# Patient Record
Sex: Male | Born: 1970 | Race: Asian | Hispanic: No | Marital: Married | Smoking: Never smoker
Health system: Southern US, Community
[De-identification: ages and names within clinical notes are randomized; demographics above are authoritative.]

## PROBLEM LIST (undated history)

## (undated) DIAGNOSIS — G4730 Sleep apnea, unspecified: Secondary | ICD-10-CM

## (undated) DIAGNOSIS — E119 Type 2 diabetes mellitus without complications: Secondary | ICD-10-CM

## (undated) DIAGNOSIS — I10 Essential (primary) hypertension: Secondary | ICD-10-CM

## (undated) HISTORY — DX: Sleep apnea, unspecified: G47.30

## (undated) HISTORY — DX: Type 2 diabetes mellitus without complications: E11.9

## (undated) HISTORY — PX: OTHER SURGICAL HISTORY: SHX169

---

## 2016-10-15 DIAGNOSIS — I10 Essential (primary) hypertension: Secondary | ICD-10-CM | POA: Insufficient documentation

## 2016-10-25 DIAGNOSIS — E782 Mixed hyperlipidemia: Secondary | ICD-10-CM | POA: Insufficient documentation

## 2017-06-24 DIAGNOSIS — E119 Type 2 diabetes mellitus without complications: Secondary | ICD-10-CM | POA: Insufficient documentation

## 2017-06-24 DIAGNOSIS — R748 Abnormal levels of other serum enzymes: Secondary | ICD-10-CM | POA: Insufficient documentation

## 2017-06-24 DIAGNOSIS — L853 Xerosis cutis: Secondary | ICD-10-CM | POA: Insufficient documentation

## 2017-06-24 DIAGNOSIS — G4733 Obstructive sleep apnea (adult) (pediatric): Secondary | ICD-10-CM | POA: Insufficient documentation

## 2017-09-30 DIAGNOSIS — E538 Deficiency of other specified B group vitamins: Secondary | ICD-10-CM | POA: Insufficient documentation

## 2018-01-06 ENCOUNTER — Other Ambulatory Visit: Payer: Self-pay | Admitting: Internal Medicine

## 2018-01-06 DIAGNOSIS — R748 Abnormal levels of other serum enzymes: Secondary | ICD-10-CM

## 2018-01-06 DIAGNOSIS — Z0000 Encounter for general adult medical examination without abnormal findings: Secondary | ICD-10-CM

## 2018-01-06 DIAGNOSIS — E559 Vitamin D deficiency, unspecified: Secondary | ICD-10-CM | POA: Insufficient documentation

## 2018-01-13 ENCOUNTER — Ambulatory Visit: Payer: BLUE CROSS/BLUE SHIELD

## 2018-01-15 ENCOUNTER — Ambulatory Visit
Admission: RE | Admit: 2018-01-15 | Discharge: 2018-01-15 | Disposition: A | Discharge status: Home / Self Care | Payer: BLUE CROSS/BLUE SHIELD | Source: Ambulatory Visit | Attending: Internal Medicine | Admitting: Internal Medicine

## 2018-01-15 DIAGNOSIS — Z0000 Encounter for general adult medical examination without abnormal findings: Secondary | ICD-10-CM | POA: Diagnosis not present

## 2018-01-15 DIAGNOSIS — R748 Abnormal levels of other serum enzymes: Secondary | ICD-10-CM | POA: Diagnosis present

## 2018-08-18 ENCOUNTER — Ambulatory Visit
Admission: RE | Admit: 2018-08-18 | Discharge: 2018-08-18 | Disposition: A | Discharge status: Home / Self Care | Payer: BLUE CROSS/BLUE SHIELD | Source: Ambulatory Visit | Attending: Internal Medicine | Admitting: Internal Medicine

## 2018-08-18 ENCOUNTER — Other Ambulatory Visit: Payer: Self-pay | Admitting: Internal Medicine

## 2018-08-18 DIAGNOSIS — R1032 Left lower quadrant pain: Secondary | ICD-10-CM | POA: Diagnosis present

## 2018-08-18 HISTORY — DX: Essential (primary) hypertension: I10

## 2018-08-18 HISTORY — DX: Type 2 diabetes mellitus without complications: E11.9

## 2018-08-18 LAB — POCT I-STAT CREATININE: CREATININE: 0.7 mg/dL (ref 0.61–1.24)

## 2018-08-18 MED ORDER — IOHEXOL 300 MG/ML  SOLN
100.0000 mL | Freq: Once | INTRAMUSCULAR | Status: AC | PRN
  Administered 2018-08-18: 100 mL via INTRAVENOUS

## 2018-08-21 DIAGNOSIS — K5909 Other constipation: Secondary | ICD-10-CM | POA: Insufficient documentation

## 2019-09-08 ENCOUNTER — Other Ambulatory Visit: Payer: Self-pay | Admitting: Internal Medicine

## 2019-09-08 DIAGNOSIS — R748 Abnormal levels of other serum enzymes: Secondary | ICD-10-CM

## 2019-09-15 ENCOUNTER — Other Ambulatory Visit: Payer: Self-pay

## 2019-09-15 ENCOUNTER — Ambulatory Visit
Admission: RE | Admit: 2019-09-15 | Discharge: 2019-09-15 | Disposition: A | Discharge status: Home / Self Care | Payer: BC Managed Care – PPO | Source: Ambulatory Visit | Attending: Internal Medicine | Admitting: Internal Medicine

## 2019-09-15 DIAGNOSIS — R748 Abnormal levels of other serum enzymes: Secondary | ICD-10-CM | POA: Diagnosis present

## 2019-11-28 ENCOUNTER — Ambulatory Visit: Payer: BC Managed Care – PPO | Attending: Internal Medicine

## 2019-11-28 DIAGNOSIS — Z23 Encounter for immunization: Secondary | ICD-10-CM

## 2019-11-28 NOTE — Progress Notes (Signed)
   Covid-19 Vaccination Clinic  Name:  Garrett Haley    MRN: [id number] DOB: [birth year]  11/28/2019  [name] was observed post Covid-19 immunization for 15 minutes without incident. He was provided with Vaccine Information Sheet and instruction to access the V-Safe system.   [name] was instructed to call 911 with any severe reactions post vaccine: Marland Kitchen Difficulty breathing  . Swelling of face and throat  . A fast heartbeat  . A bad rash all over body  . Dizziness and weakness   Immunizations Administered    Name Date Dose VIS Date Route   Pfizer COVID-19 Vaccine 11/28/2019 10:04 AM 0.3 mL 08/21/2019 Intramuscular   Manufacturer: [employer], [employer]   Lot: [id number]   NDC: [address]-1

## 2019-12-23 ENCOUNTER — Ambulatory Visit: Payer: BC Managed Care – PPO | Attending: Internal Medicine

## 2019-12-23 DIAGNOSIS — Z23 Encounter for immunization: Secondary | ICD-10-CM

## 2019-12-23 NOTE — Progress Notes (Signed)
   Covid-19 Vaccination Clinic  Name:  Phillippe A. Gift    MRN: [id number] DOB: 1971-08-31  12/23/2019  Mr. Cornforth was observed post Covid-19 immunization for 15 minutes without incident. He was provided with Vaccine Information Sheet and instruction to access the V-Safe system.   Mr. Channing was instructed to call 911 with any severe reactions post vaccine: Marland Kitchen Difficulty breathing  . Swelling of face and throat  . A fast heartbeat  . A bad rash all over body  . Dizziness and weakness   Immunizations Administered    Name Date Dose VIS Date Route   Pfizer COVID-19 Vaccine 12/23/2019 10:06 AM 0.3 mL 08/21/2019 Intramuscular   Manufacturer: [employer], [employer]   Lot: [id number]   NDC: [address]-9

## 2020-07-08 IMAGING — US US ABDOMEN COMPLETE
1 series · 13 of 25 positions shown · non-contrast
Comparison: CT dated August 18, 2018.

CLINICAL DATA: Abnormal liver function tests.

EXAM:
ABDOMEN ULTRASOUND COMPLETE

[Series 1: us abdomen complete · 0.25mm/px · 13 of 81 slices shown]
[im 1/81]
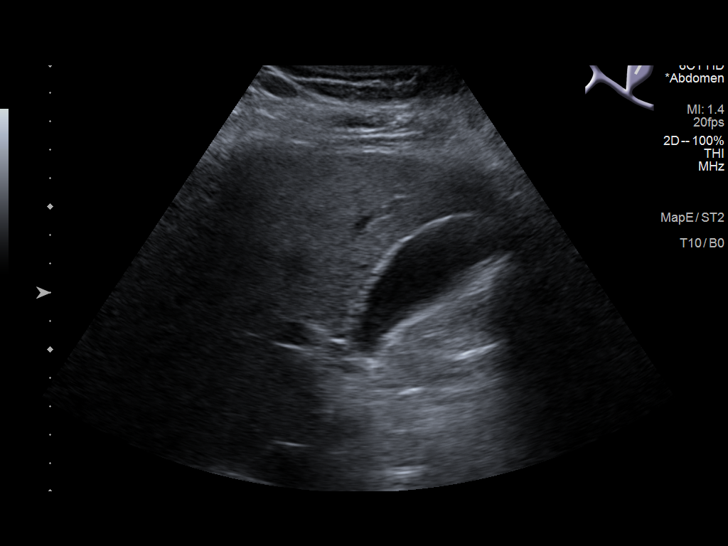
[im 7/81]
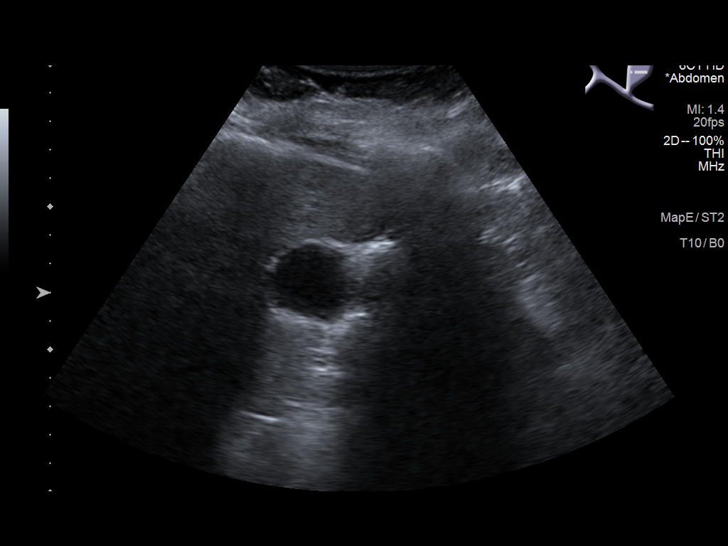
[im 14/81]
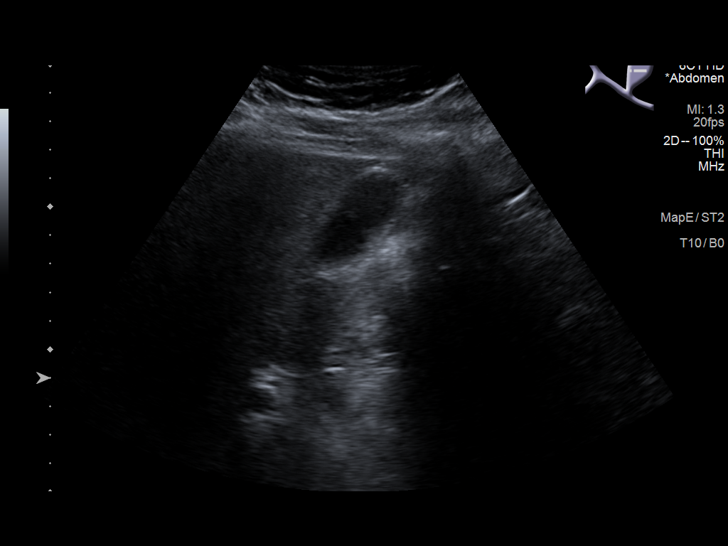
[im 21/81]
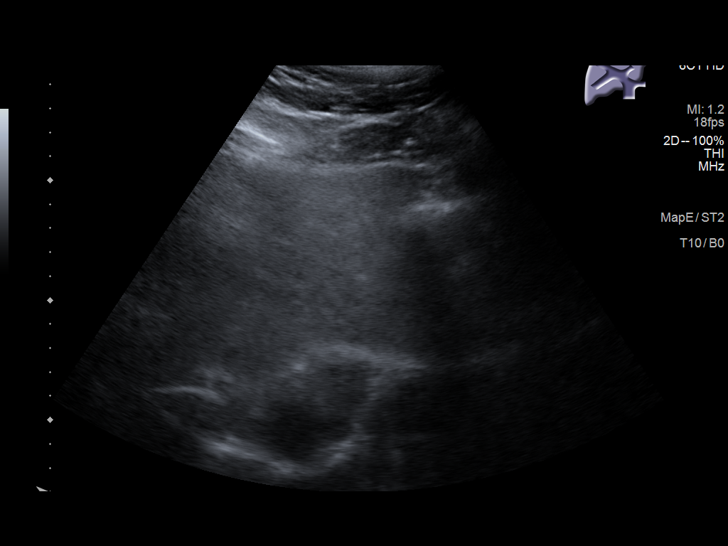
[im 27/81]
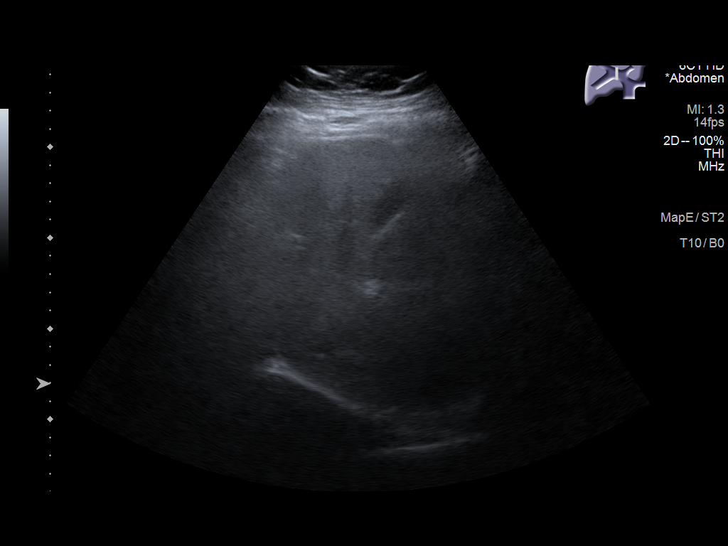
[im 34/81]
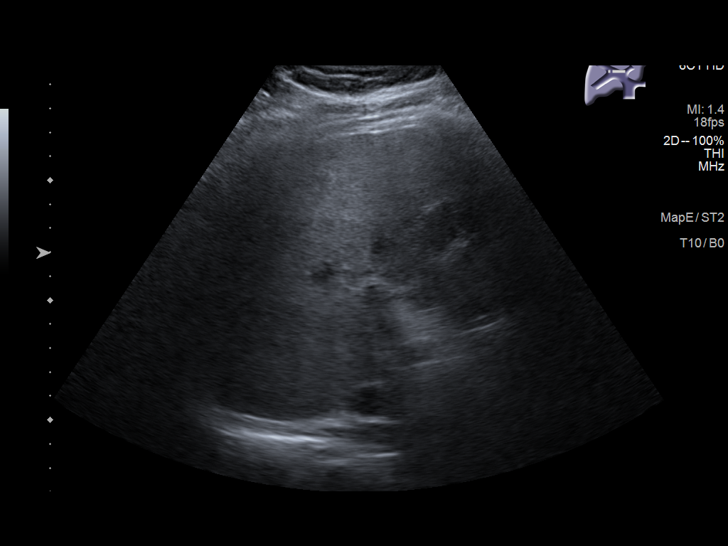
[im 41/81]
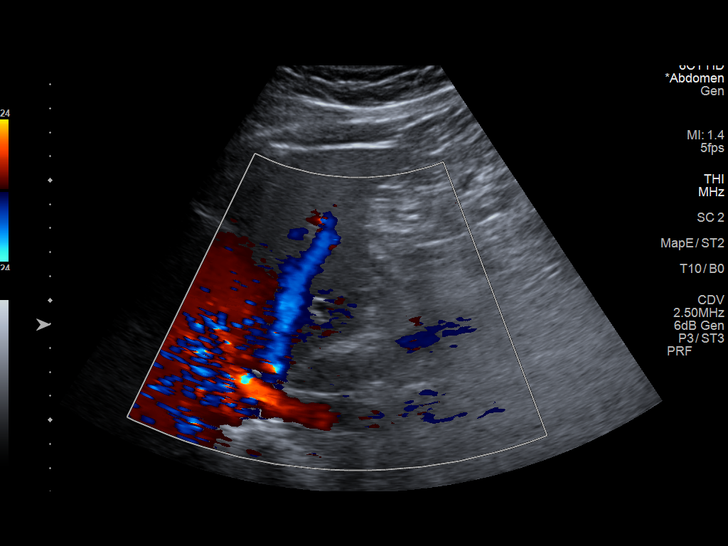
[im 47/81]
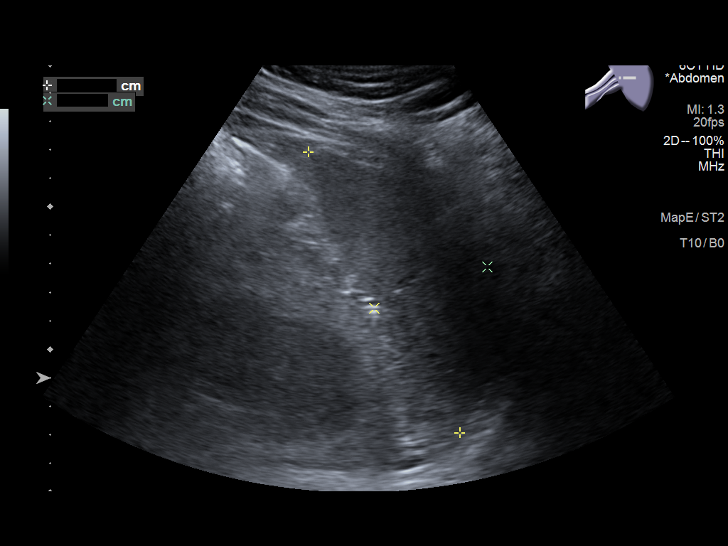
[im 54/81]
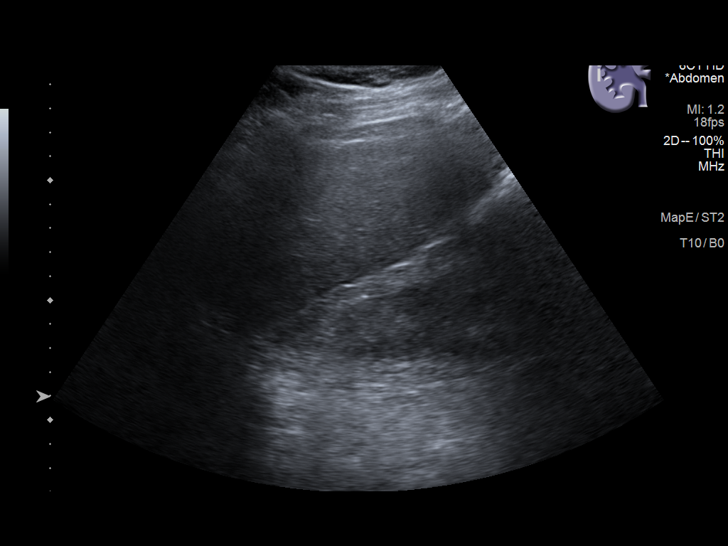
[im 61/81]
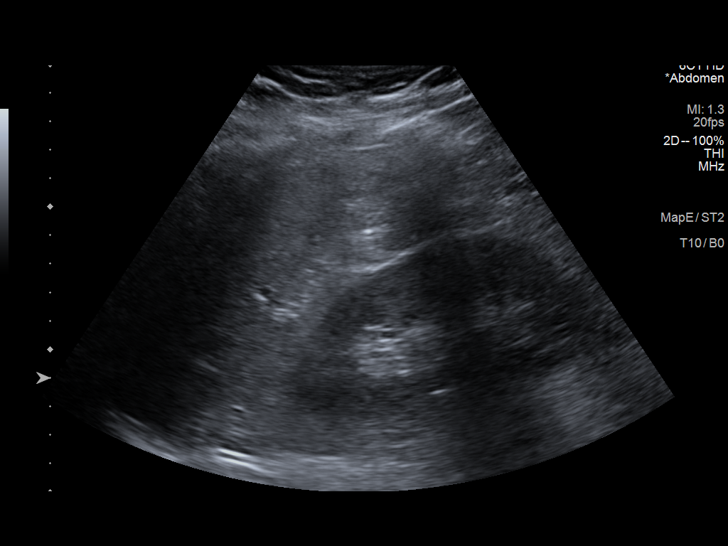
[im 67/81]
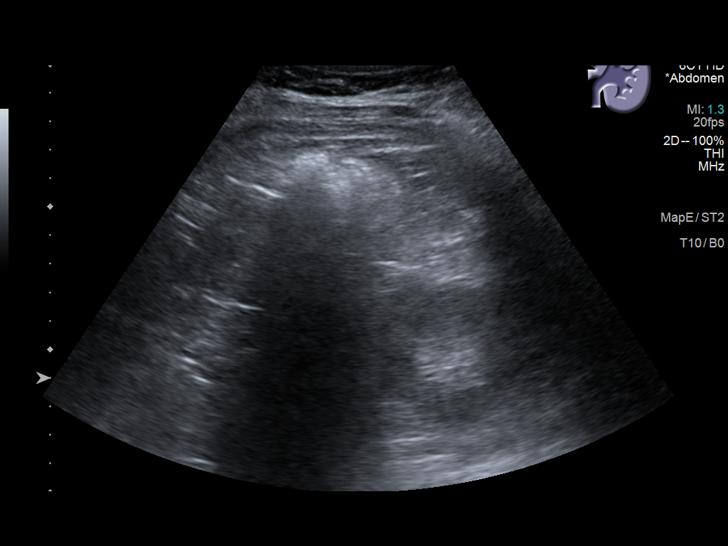
[im 74/81]
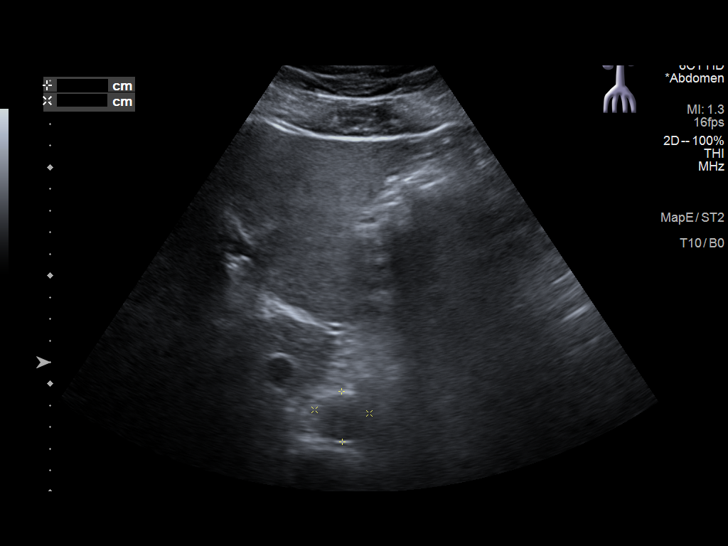
[im 81/81]
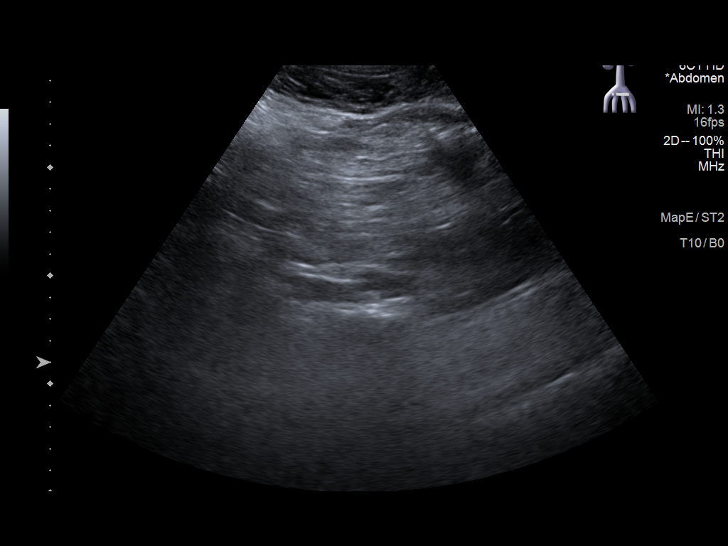

[13 of 25 positions shown; findings below may reference images not displayed]

FINDINGS: Gallbladder: No gallstones or wall thickening visualized. No
sonographic Murphy sign noted by sonographer.

Common bile duct: Diameter: 3.2 mm

Liver: Diffuse increased echogenicity with slightly heterogeneous
liver. Appearance typically secondary to fatty infiltration.
Fibrosis secondary consideration. No secondary findings of cirrhosis
noted. No focal hepatic lesion or intrahepatic biliary duct
dilatation. Portal vein is patent on color Doppler imaging with
normal direction of blood flow towards the liver.

IVC: No abnormality visualized.

Pancreas: The pancreas was suboptimally evaluated secondary to
overlying bowel gas and poor sonographic windows.

Spleen: Size and appearance within normal limits.

Right Kidney: Length: 10.7 cm. Echogenicity within normal limits. No
mass or hydronephrosis visualized.

Left Kidney: Length: 10.4 cm. Echogenicity within normal limits. No
mass or hydronephrosis visualized.

Abdominal aorta: No aneurysm visualized.

Other findings: None.
IMPRESSION: 1. No acute abnormality.  No evidence for cholelithiasis.
2. Hepatic steatosis.
3. Pancreas poorly evaluated secondary to poor sonographic windows.

## 2021-07-19 ENCOUNTER — Ambulatory Visit
Admission: RE | Admit: 2021-07-19 | Discharge: 2021-07-19 | Disposition: A | Discharge status: Home / Self Care | Payer: 59 | Source: Ambulatory Visit | Attending: Physician Assistant | Admitting: Physician Assistant

## 2021-07-19 ENCOUNTER — Other Ambulatory Visit: Payer: Self-pay

## 2021-07-19 ENCOUNTER — Other Ambulatory Visit: Payer: Self-pay | Admitting: Physician Assistant

## 2021-07-19 ENCOUNTER — Other Ambulatory Visit (HOSPITAL_COMMUNITY): Payer: Self-pay | Admitting: Physician Assistant

## 2021-07-19 DIAGNOSIS — M7989 Other specified soft tissue disorders: Secondary | ICD-10-CM | POA: Diagnosis present

## 2022-05-12 IMAGING — US US EXTREM LOW VENOUS*R*
1 series · 14 of 24 positions shown · non-contrast
Comparison: None.

CLINICAL DATA: Acute right lower extremity swelling.

EXAM:
Right LOWER EXTREMITY VENOUS DOPPLER ULTRASOUND
TECHNIQUE: Gray-scale sonography with compression, as well as color and duplex
ultrasound, were performed to evaluate the deep venous system(s)
from the level of the common femoral vein through the popliteal and
proximal calf veins.

[Series 1: us extrem low venous*right* · 0.11mm/px · 14 of 33 slices shown]
[im 1/33]
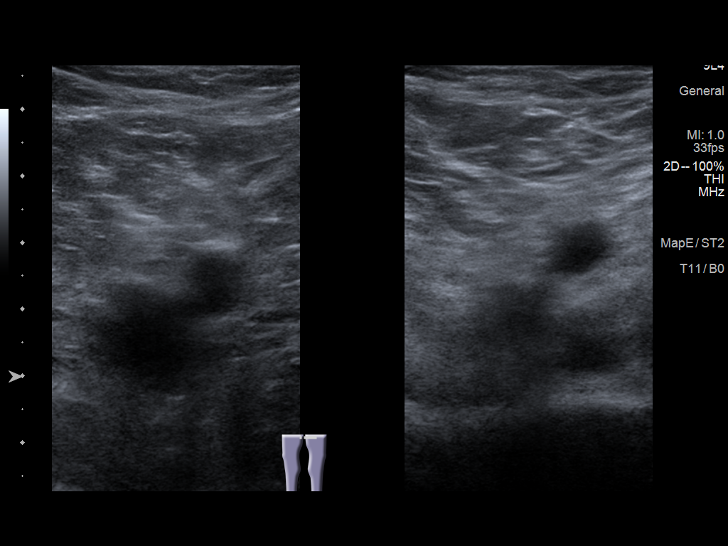
[im 3/33]
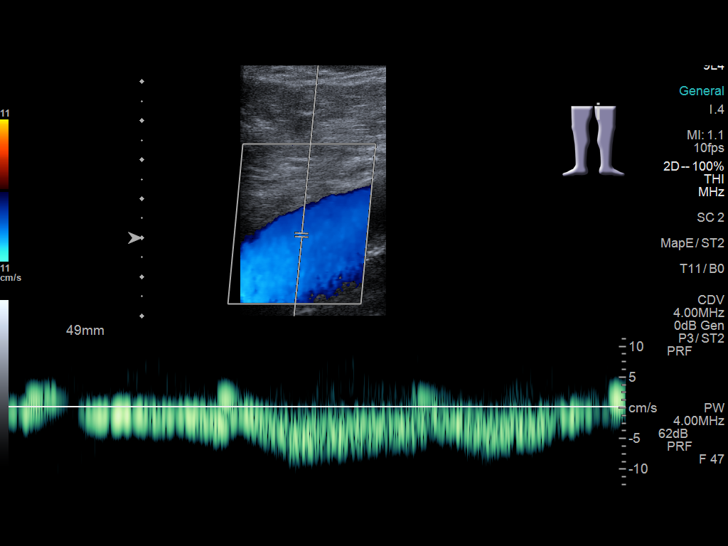
[im 6/33]
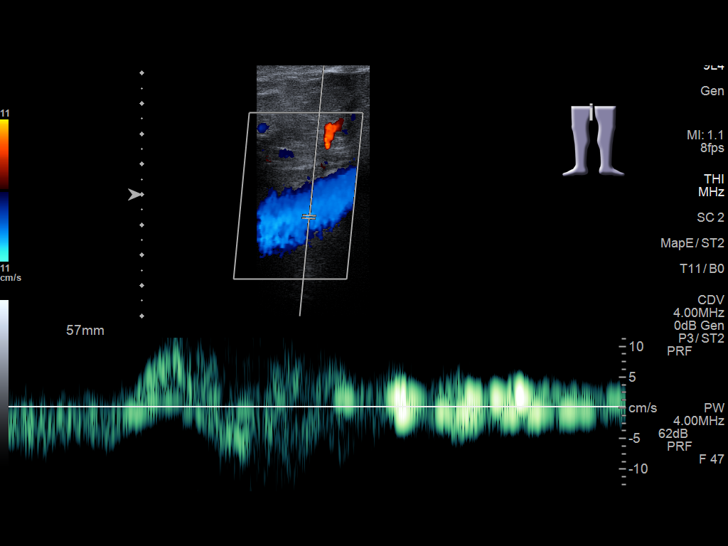
[im 9/33]
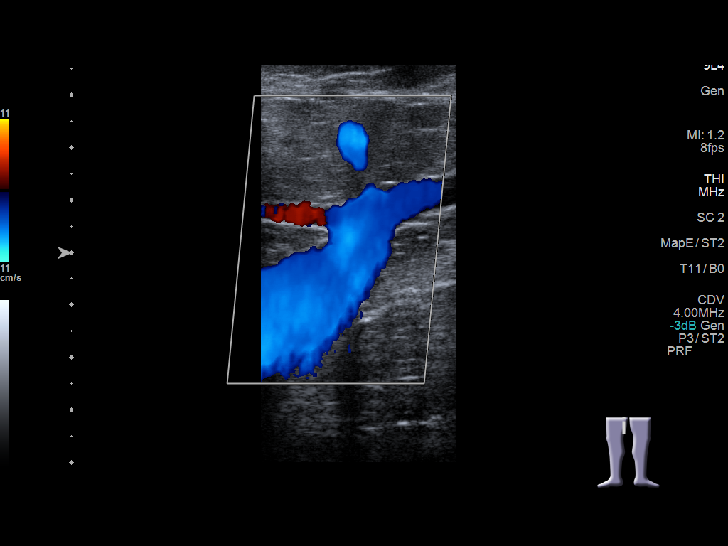
[im 10/33]
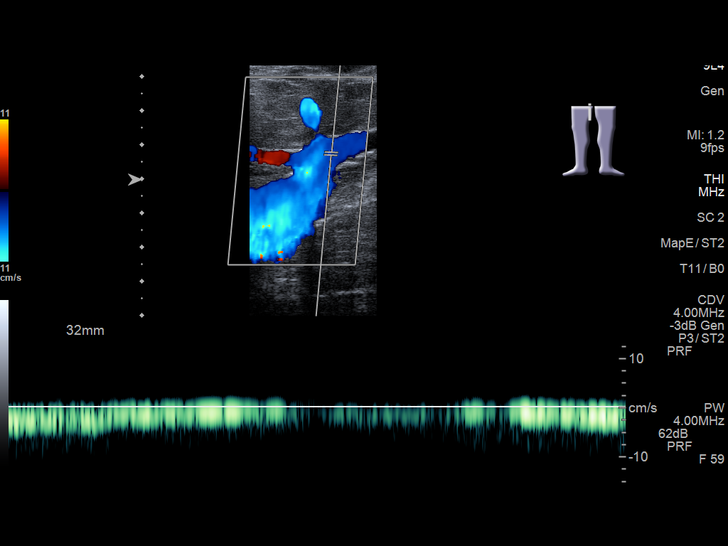
[im 13/33]
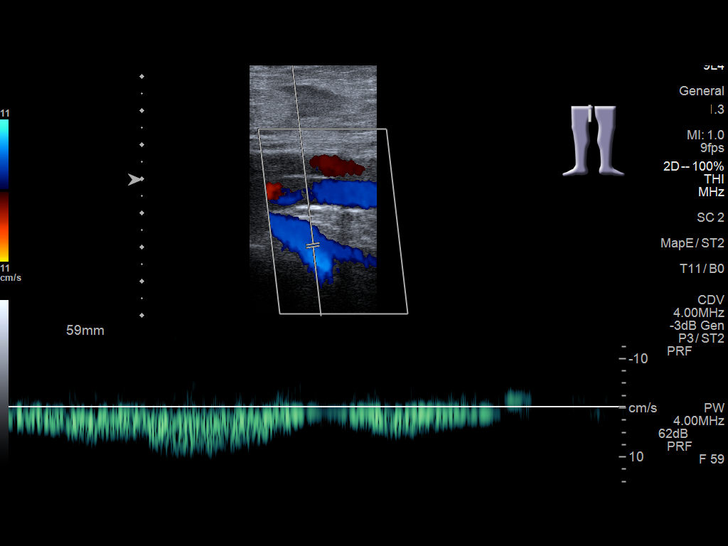
[im 16/33]
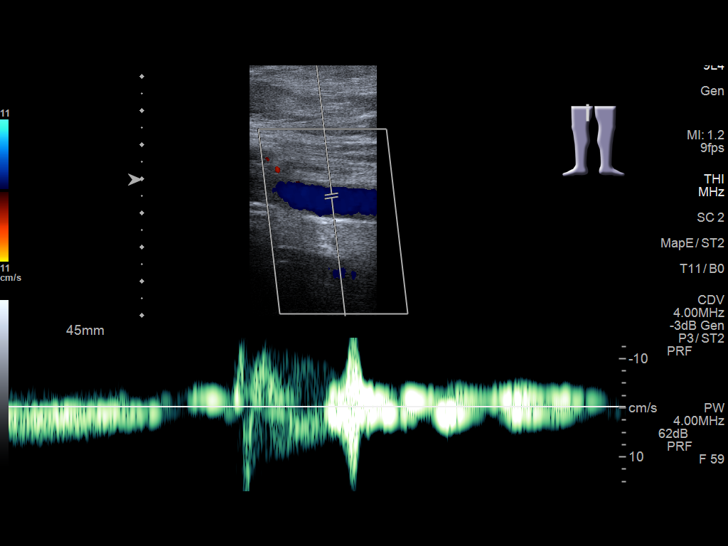
[im 17/33]
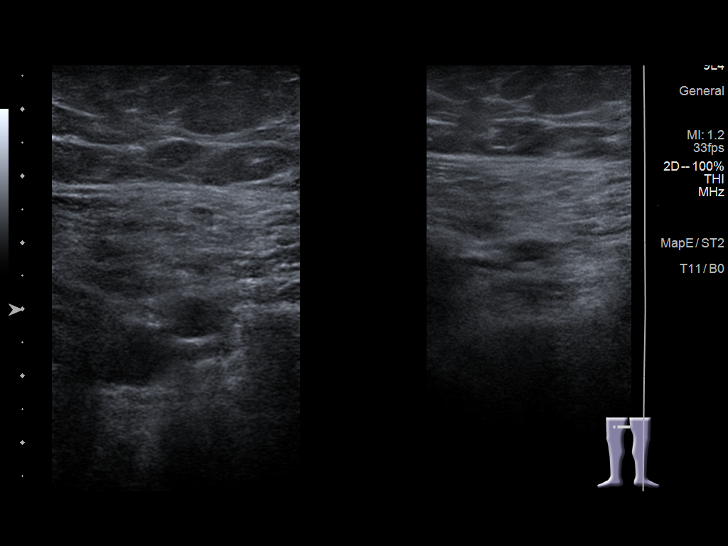
[im 20/33]
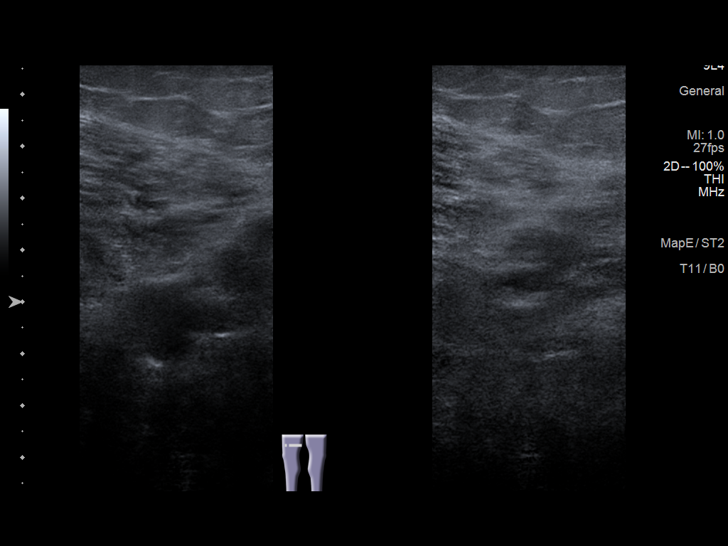
[im 23/33]
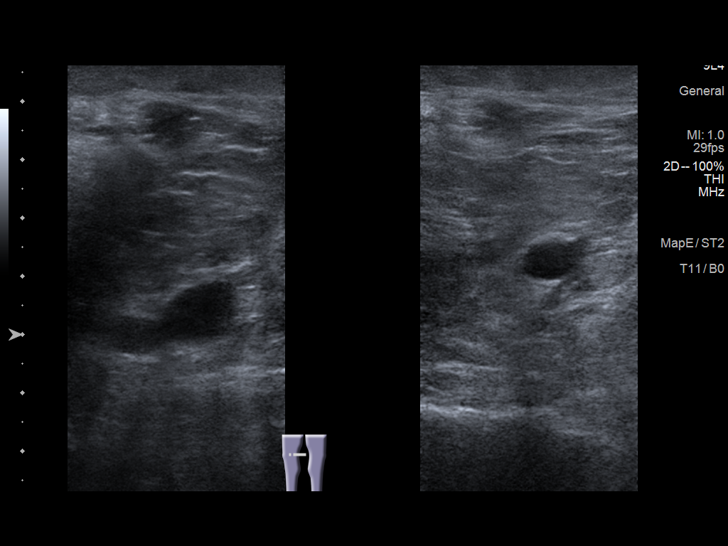
[im 26/33]
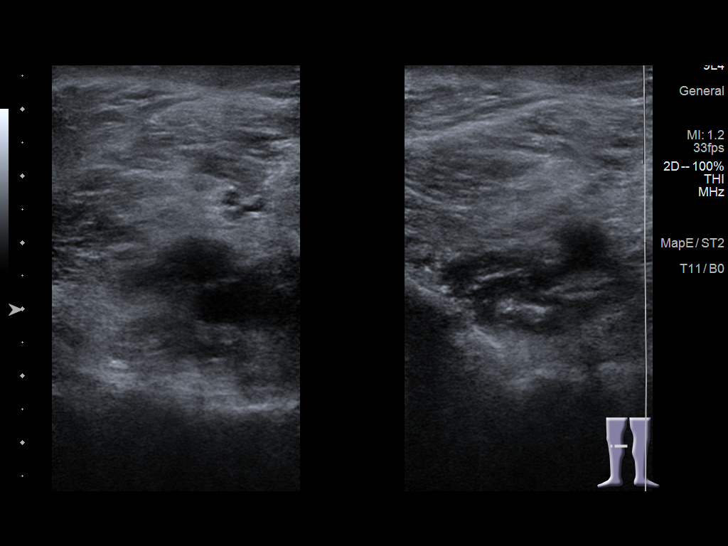
[im 27/33]
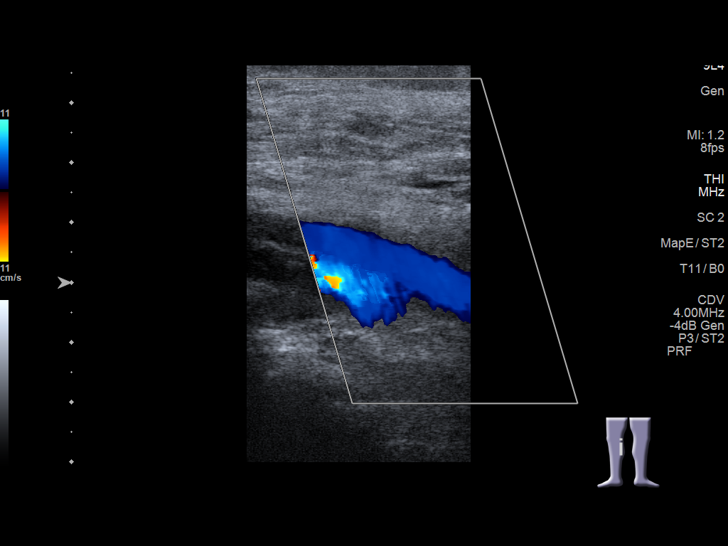
[im 30/33]
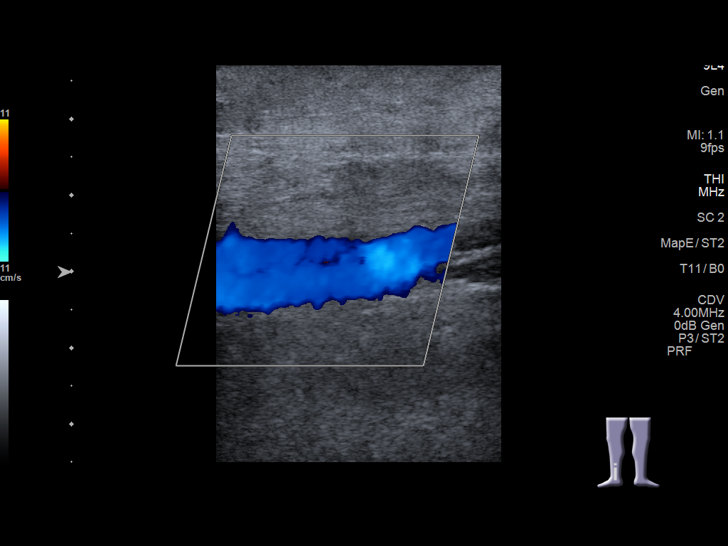
[im 33/33]
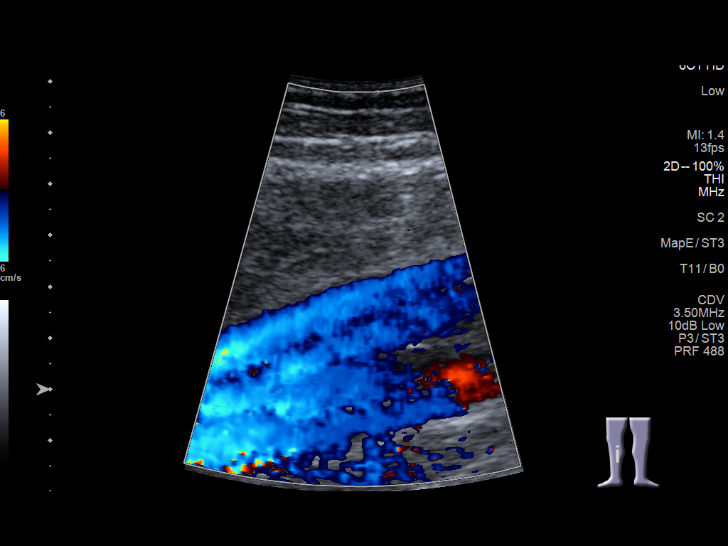

[14 of 24 positions shown; findings below may reference images not displayed]

FINDINGS: VENOUS

Normal compressibility of the common femoral, superficial femoral,
and popliteal veins, as well as the visualized calf veins.
Visualized portions of profunda femoral vein and great saphenous
vein unremarkable. No filling defects to suggest DVT on grayscale or
color Doppler imaging. Doppler waveforms show normal direction of
venous flow, normal respiratory plasticity and response to
augmentation.

Limited views of the contralateral common femoral vein are
unremarkable.

OTHER

None.

Limitations: Limited due to soft tissue edema.
IMPRESSION: Negative.

## 2022-09-20 DIAGNOSIS — E559 Vitamin D deficiency, unspecified: Secondary | ICD-10-CM | POA: Diagnosis not present

## 2022-09-20 DIAGNOSIS — Z0000 Encounter for general adult medical examination without abnormal findings: Secondary | ICD-10-CM | POA: Diagnosis not present

## 2022-09-20 DIAGNOSIS — Z125 Encounter for screening for malignant neoplasm of prostate: Secondary | ICD-10-CM | POA: Diagnosis not present

## 2022-09-20 DIAGNOSIS — E119 Type 2 diabetes mellitus without complications: Secondary | ICD-10-CM | POA: Diagnosis not present

## 2022-09-20 DIAGNOSIS — E538 Deficiency of other specified B group vitamins: Secondary | ICD-10-CM | POA: Diagnosis not present

## 2022-09-27 DIAGNOSIS — E559 Vitamin D deficiency, unspecified: Secondary | ICD-10-CM | POA: Diagnosis not present

## 2022-09-27 DIAGNOSIS — Z0000 Encounter for general adult medical examination without abnormal findings: Secondary | ICD-10-CM | POA: Diagnosis not present

## 2022-09-27 DIAGNOSIS — E1165 Type 2 diabetes mellitus with hyperglycemia: Secondary | ICD-10-CM | POA: Diagnosis not present

## 2022-09-27 DIAGNOSIS — E782 Mixed hyperlipidemia: Secondary | ICD-10-CM | POA: Diagnosis not present

## 2022-09-27 DIAGNOSIS — E1100 Type 2 diabetes mellitus with hyperosmolarity without nonketotic hyperglycemic-hyperosmolar coma (NKHHC): Secondary | ICD-10-CM | POA: Diagnosis not present

## 2022-09-27 DIAGNOSIS — H539 Unspecified visual disturbance: Secondary | ICD-10-CM | POA: Diagnosis not present

## 2022-09-27 DIAGNOSIS — E538 Deficiency of other specified B group vitamins: Secondary | ICD-10-CM | POA: Diagnosis not present

## 2022-11-08 DIAGNOSIS — E538 Deficiency of other specified B group vitamins: Secondary | ICD-10-CM | POA: Diagnosis not present

## 2022-11-08 DIAGNOSIS — E1165 Type 2 diabetes mellitus with hyperglycemia: Secondary | ICD-10-CM | POA: Diagnosis not present

## 2022-11-08 DIAGNOSIS — E782 Mixed hyperlipidemia: Secondary | ICD-10-CM | POA: Diagnosis not present

## 2022-11-08 DIAGNOSIS — I10 Essential (primary) hypertension: Secondary | ICD-10-CM | POA: Diagnosis not present

## 2022-12-26 ENCOUNTER — Ambulatory Visit
Admission: RE | Admit: 2022-12-26 | Discharge: 2022-12-26 | Disposition: A | Discharge status: Home / Self Care | Payer: 59 | Source: Ambulatory Visit | Attending: Internal Medicine | Admitting: Internal Medicine

## 2022-12-26 ENCOUNTER — Other Ambulatory Visit: Payer: Self-pay | Admitting: Internal Medicine

## 2022-12-26 ENCOUNTER — Other Ambulatory Visit: Payer: Self-pay

## 2022-12-26 DIAGNOSIS — I10 Essential (primary) hypertension: Secondary | ICD-10-CM | POA: Diagnosis not present

## 2022-12-26 DIAGNOSIS — M7989 Other specified soft tissue disorders: Secondary | ICD-10-CM | POA: Diagnosis not present

## 2022-12-26 DIAGNOSIS — R600 Localized edema: Secondary | ICD-10-CM

## 2022-12-26 DIAGNOSIS — E1165 Type 2 diabetes mellitus with hyperglycemia: Secondary | ICD-10-CM | POA: Diagnosis not present

## 2022-12-31 ENCOUNTER — Emergency Department
Admission: EM | Admit: 2022-12-31 | Discharge: 2022-12-31 | Disposition: A | Discharge status: Home / Self Care | Payer: 59 | Attending: Emergency Medicine | Admitting: Emergency Medicine

## 2022-12-31 ENCOUNTER — Other Ambulatory Visit: Payer: Self-pay

## 2022-12-31 ENCOUNTER — Encounter: Payer: Self-pay | Admitting: Emergency Medicine

## 2022-12-31 DIAGNOSIS — E119 Type 2 diabetes mellitus without complications: Secondary | ICD-10-CM | POA: Diagnosis not present

## 2022-12-31 DIAGNOSIS — I8391 Asymptomatic varicose veins of right lower extremity: Secondary | ICD-10-CM | POA: Diagnosis not present

## 2022-12-31 DIAGNOSIS — Z7982 Long term (current) use of aspirin: Secondary | ICD-10-CM | POA: Insufficient documentation

## 2022-12-31 DIAGNOSIS — I10 Essential (primary) hypertension: Secondary | ICD-10-CM | POA: Insufficient documentation

## 2022-12-31 DIAGNOSIS — S80811A Abrasion, right lower leg, initial encounter: Secondary | ICD-10-CM | POA: Insufficient documentation

## 2022-12-31 DIAGNOSIS — X58XXXA Exposure to other specified factors, initial encounter: Secondary | ICD-10-CM | POA: Insufficient documentation

## 2022-12-31 DIAGNOSIS — R58 Hemorrhage, not elsewhere classified: Secondary | ICD-10-CM | POA: Diagnosis not present

## 2022-12-31 DIAGNOSIS — I83811 Varicose veins of right lower extremities with pain: Secondary | ICD-10-CM | POA: Diagnosis not present

## 2022-12-31 DIAGNOSIS — S8991XA Unspecified injury of right lower leg, initial encounter: Secondary | ICD-10-CM | POA: Diagnosis not present

## 2022-12-31 DIAGNOSIS — T148XXA Other injury of unspecified body region, initial encounter: Secondary | ICD-10-CM

## 2022-12-31 LAB — CBC WITH DIFFERENTIAL/PLATELET
Abs Immature Granulocytes: 0.03 10*3/uL (ref 0.00–0.07)
Basophils Absolute: 0 10*3/uL (ref 0.0–0.1)
Basophils Relative: 0 %
Eosinophils Absolute: 0.3 10*3/uL (ref 0.0–0.5)
Eosinophils Relative: 4 %
HCT: 41.2 % (ref 39.0–52.0)
Hemoglobin: 13.2 g/dL (ref 13.0–17.0)
Immature Granulocytes: 0 %
Lymphocytes Relative: 28 %
Lymphs Abs: 2.1 10*3/uL (ref 0.7–4.0)
MCH: 27.2 pg (ref 26.0–34.0)
MCHC: 32 g/dL (ref 30.0–36.0)
MCV: 84.9 fL (ref 80.0–100.0)
Monocytes Absolute: 0.5 10*3/uL (ref 0.1–1.0)
Monocytes Relative: 6 %
Neutro Abs: 4.6 10*3/uL (ref 1.7–7.7)
Neutrophils Relative %: 62 %
Platelets: 287 10*3/uL (ref 150–400)
RBC: 4.85 MIL/uL (ref 4.22–5.81)
RDW: 13.2 % (ref 11.5–15.5)
WBC: 7.5 10*3/uL (ref 4.0–10.5)
nRBC: 0 % (ref 0.0–0.2)

## 2022-12-31 NOTE — ED Provider Notes (Signed)
[facility] Provider Note    Event Date/Time   First MD Initiated Contact with Patient 12/31/22 1048     (approximate)   History   Leg Injury   HPI  Garrett Haley is a 52 y.o. male with a past medical history of lower extremity edema, hypertension, diabetes who presents today for evaluation of bleeding from a varicosity in his right lower extremity.  Patient reports that he has had swelling in his leg for the past 2 to 3 weeks.  He underwent ultrasound which he reports was negative.  He reports that he scratched his leg earlier today and it started bleeding.  He reports that he was unable to get it to stop bleeding for 15 minutes and so his brother called EMS.  Patient reports that the bleeding has stopped.  He denies any pain.  No fevers or chills.  No numbness or tingling.  No specific injury other than scratching.  He reports that he takes a baby aspirin, no blood thinners.  He reports that his primary care provider put him on a antibiotic and he has taken 1 day of this so far.  There are no problems to display for this patient.         Physical Exam   Triage Vital Signs: ED Triage Vitals  Enc Vitals Group     BP 12/31/22 1029 134/87     Pulse Rate 12/31/22 1029 80     Resp 12/31/22 1029 18     Temp 12/31/22 1029 98.3 F (36.8 C)     Temp Source 12/31/22 1029 Oral     SpO2 12/31/22 1029 99 %     Weight --      Height --      Head Circumference --      Peak Flow --      Pain Score 12/31/22 1028 0     Pain Loc --      Pain Edu? --      Excl. in GC? --     Most recent vital signs: Vitals:   12/31/22 1029  BP: 134/87  Pulse: 80  Resp: 18  Temp: 98.3 F (36.8 C)  SpO2: 99%    Physical Exam Vitals and nursing note reviewed.  Constitutional:      General: Awake and alert. No acute distress.    Appearance: Normal appearance. The patient is normal weight.  HENT:     Head: Normocephalic and atraumatic.     Mouth: Mucous  membranes are moist.  Eyes:     General: PERRL. Normal EOMs        Right eye: No discharge.        Left eye: No discharge.     Conjunctiva/sclera: Conjunctivae normal.  Cardiovascular:     Rate and Rhythm: Normal rate and regular rhythm.     Pulses: Normal pulses.     Heart sounds: Normal heart sounds Pulmonary:     Effort: Pulmonary effort is normal. No respiratory distress.     Breath sounds: Normal breath sounds.  Abdominal:     Abdomen is soft. There is no abdominal tenderness. No rebound or guarding. No distention. Musculoskeletal:        General: No swelling. Normal range of motion.     Cervical back: Normal range of motion and neck supple.  Bilateral lower extremity varicosities noted.  Right leg has a superficial varicosity with a 1 x 1 mm opening that is no longer bleeding.  2+ pedal pulses.  No surrounding erythema.  No lymphangitis.  No tenderness to palpation.  Full normal range of motion of all toes, ankle, knee, hip. Skin:    General: Skin is warm and dry.     Capillary Refill: Capillary refill takes less than 2 seconds.     Findings: No rash.  Neurological:     Mental Status: The patient is awake and alert.      ED Results / Procedures / Treatments   Labs (all labs ordered are listed, but only abnormal results are displayed) Labs Reviewed  CBC WITH DIFFERENTIAL/PLATELET     EKG     RADIOLOGY     PROCEDURES:  Critical Care performed:   Procedures   MEDICATIONS ORDERED IN ED: Medications - No data to display   IMPRESSION / MDM / ASSESSMENT AND PLAN / ED COURSE  I reviewed the triage vital signs and the nursing notes.   Differential diagnosis includes, but is not limited to, varicosity, venous bleed, abrasion, puncture.  Patient is awake and alert, hemodynamically stable and afebrile.  He is nontoxic in appearance.  I reviewed the patient's chart.  Patient saw his primary care provider on 12/26/2022 at which time she ordered a DVT study as  well as started antibiotics.  Right lower extremity venous Doppler ultrasound was negative for DVT.  There was a normal respiratory plasticity and response to augmentation.  Brother-in-law is concerned about the amount of blood that the patient lost today.  CBC was ordered.  Most recent CBC that I can see in the patient's chart was 09/20/2022 at which time he had an H&H of 41.5 and 13.5.  CBC today is normal.  Patient and brother-in-law are reassured by these findings.  The area has already stopped bleeding.  The area was cleaned and bandaged.  He was instructed to follow-up with his PCP.  I recommended that he keep his leg elevated.  We also discussed compression stockings to help with his varicosities.  We discussed return precautions and outpatient follow-up.  Patient understands and agrees with plan.  He was discharged in stable condition.   Patient's presentation is most consistent with acute illness / injury with system symptoms.     FINAL CLINICAL IMPRESSION(S) / ED DIAGNOSES   Final diagnoses:  Varicose veins of right lower extremity, unspecified whether complicated  Abrasion     Rx / DC Orders   ED Discharge Orders     None        Note:  This document was prepared using Dragon voice recognition software and may include unintentional dictation errors.   Keturah Shavers 12/31/22 1700    Jene Every, MD 01/01/23 1058

## 2022-12-31 NOTE — ED Notes (Signed)
AVS with prescriptions provided to and discussed with patient and family member at bedside. Pt verbalizes understanding of discharge instructions and denies any questions or concerns at this time. Pt has ride home. Pt ambulated out of department independently with steady gait.  

## 2022-12-31 NOTE — Discharge Instructions (Addendum)
Keep your leg elevated.  You may wear compression stockings at work while you are standing for long periods of time.  You may remove this bandage in 24 hours.  Keep the area clean and dry, wash with soap and water.  Please return for any new, worsening, or change in symptoms or other concerns.  It was a pleasure caring for you today.

## 2022-12-31 NOTE — ED Triage Notes (Signed)
Patient to ED via [facility] from home for leg bleeding. Has hx of wound in same. Patient states leg was itching and leg started bleeding- lost approx blood per EMS. EMS wrapped leg and bleeding controlled at this time. Blood was spurting and on wall at home. VS WNL with EMS.

## 2023-01-07 DIAGNOSIS — E1165 Type 2 diabetes mellitus with hyperglycemia: Secondary | ICD-10-CM | POA: Diagnosis not present

## 2023-02-26 ENCOUNTER — Other Ambulatory Visit (INDEPENDENT_AMBULATORY_CARE_PROVIDER_SITE_OTHER): Payer: Self-pay | Admitting: Nurse Practitioner

## 2023-02-26 DIAGNOSIS — I83893 Varicose veins of bilateral lower extremities with other complications: Secondary | ICD-10-CM

## 2023-02-28 ENCOUNTER — Ambulatory Visit (INDEPENDENT_AMBULATORY_CARE_PROVIDER_SITE_OTHER): Payer: 59

## 2023-02-28 ENCOUNTER — Encounter (INDEPENDENT_AMBULATORY_CARE_PROVIDER_SITE_OTHER): Payer: Self-pay | Admitting: Vascular Surgery

## 2023-02-28 ENCOUNTER — Ambulatory Visit (INDEPENDENT_AMBULATORY_CARE_PROVIDER_SITE_OTHER): Payer: 59 | Admitting: Vascular Surgery

## 2023-02-28 VITALS — BP 113/71 | HR 80 | Resp 18 | Ht 70.0 in | Wt 248.4 lb

## 2023-02-28 DIAGNOSIS — I83893 Varicose veins of bilateral lower extremities with other complications: Secondary | ICD-10-CM | POA: Diagnosis not present

## 2023-02-28 DIAGNOSIS — I8310 Varicose veins of unspecified lower extremity with inflammation: Secondary | ICD-10-CM

## 2023-02-28 DIAGNOSIS — I872 Venous insufficiency (chronic) (peripheral): Secondary | ICD-10-CM | POA: Diagnosis not present

## 2023-02-28 DIAGNOSIS — I83891 Varicose veins of right lower extremities with other complications: Secondary | ICD-10-CM | POA: Diagnosis not present

## 2023-02-28 DIAGNOSIS — I10 Essential (primary) hypertension: Secondary | ICD-10-CM | POA: Diagnosis not present

## 2023-02-28 DIAGNOSIS — E119 Type 2 diabetes mellitus without complications: Secondary | ICD-10-CM | POA: Diagnosis not present

## 2023-03-02 ENCOUNTER — Encounter (INDEPENDENT_AMBULATORY_CARE_PROVIDER_SITE_OTHER): Payer: Self-pay | Admitting: Vascular Surgery

## 2023-03-02 DIAGNOSIS — I872 Venous insufficiency (chronic) (peripheral): Secondary | ICD-10-CM | POA: Insufficient documentation

## 2023-03-02 DIAGNOSIS — I8310 Varicose veins of unspecified lower extremity with inflammation: Secondary | ICD-10-CM | POA: Insufficient documentation

## 2023-03-02 DIAGNOSIS — I83891 Varicose veins of right lower extremities with other complications: Secondary | ICD-10-CM | POA: Insufficient documentation

## 2023-03-02 NOTE — Progress Notes (Signed)
MRN : [id number]  Garrett Haley is a 52 y.o. (Jul 10, 1971) male who presents with chief complaint of varicose veins hurt.  History of Present Illness:   The patient is seen for evaluation of symptomatic varicose veins. The patient relates burning and stinging which worsened steadily throughout the course of the day, particularly with standing. The patient also notes an aching and throbbing pain over the varicosities, particularly with prolonged dependent positions. The symptoms are significantly improved with elevation.  The patient also notes that during hot weather the symptoms are greatly intensified. The patient states the pain from the varicose veins interferes with work, daily exercise, shopping and household maintenance. At this point, the symptoms are persistent and severe enough that they're having a negative impact on lifestyle and are interfering with daily activities.  Patient has had 2 episodes of hemorrhage from the right ankle.  The most recent of which required EMS and evaluation in the emergency room.  There is no history of DVT, PE or superficial thrombophlebitis. There is no history of ulceration. The patient denies a significant family history of varicose veins.  The patient has not worn graduated compression in the past. At the present time the patient has not been using over-the-counter analgesics. There is no history of prior surgical intervention or sclerotherapy.  Duplex ultrasound obtained today is of the right leg only.  No evidence of deep venous insufficiency is identified.  No superficial reflux is noted in the great saphenous vein however scattered varicosities are identified.  Current Meds  Medication Sig   amoxicillin (AMOXIL) 500 MG capsule Take 500 mg by mouth 3 (three) times daily.   aspirin EC 81 MG tablet Take 81 mg by mouth once.   atorvastatin (LIPITOR) 10 MG tablet Take 10 mg by mouth daily.   clotrimazole-betamethasone  (LOTRISONE) cream Apply 1 Application topically 2 (two) times daily.   furosemide (LASIX) 20 MG tablet Take 20 mg by mouth daily.   glipiZIDE (GLUCOTROL XL) 10 MG 24 hr tablet Take 10 mg by mouth daily with breakfast.   lisinopril-hydrochlorothiazide (ZESTORETIC) 20-12.5 MG tablet Take 1 tablet by mouth daily.   meloxicam (MOBIC) 15 MG tablet Take 15 mg by mouth daily.   metFORMIN (GLUCOPHAGE) 500 MG tablet Take 500 mg by mouth 2 (two) times daily with a meal.   Vitamin D, Ergocalciferol, (DRISDOL) 1.25 MG (50000 UNIT) CAPS capsule Take 50,000 Units by mouth once a week.    Past Medical History:  Diagnosis Date   Hypertension    Sleep apnea    Type 2 diabetes mellitus without complication (HCC)     Past Surgical History:  Procedure Laterality Date   None      Social History Social History   Tobacco Use   Smoking status: Never    Passive exposure: Never   Smokeless tobacco: Never  Vaping Use   Vaping Use: Never used  Substance Use Topics   Alcohol use: Never   Drug use: Never    Family History Family History  Problem Relation Age of Onset   Heart attack Father    Hypertension Father     No Known Allergies   REVIEW OF SYSTEMS (Negative unless checked)  Constitutional: [] Weight loss  [] Fever  [] Chills Cardiac: [] Chest pain   [] Chest pressure   [] Palpitations   [] Shortness of breath when laying flat   [] Shortness of breath with exertion. Vascular:  [] Pain in legs  with walking   [x] Pain in legs with standing  [] History of DVT   [] Phlebitis   [] Swelling in legs   [x] Varicose veins   [] Non-healing ulcers Pulmonary:   [] Uses home oxygen   [] Productive cough   [] Hemoptysis   [] Wheeze  [] COPD   [] Asthma Neurologic:  [] Dizziness   [] Seizures   [] History of stroke   [] History of TIA  [] Aphasia   [] Vissual changes   [] Weakness or numbness in arm   [] Weakness or numbness in leg Musculoskeletal:   [] Joint swelling   [] Joint pain   [] Low back pain Hematologic:  [] Easy bruising   [] Easy bleeding   [] Hypercoagulable state   [] Anemic Gastrointestinal:  [] Diarrhea   [] Vomiting  [] Gastroesophageal reflux/heartburn   [] Difficulty swallowing. Genitourinary:  [] Chronic kidney disease   [] Difficult urination  [] Frequent urination   [] Blood in urine Skin:  [] Rashes   [] Ulcers  Psychological:  [] History of anxiety   []  History of major depression.  Physical Examination  Vitals:   02/28/23 1446  BP: 113/71  Pulse: 80  Resp: 18  Weight: 248 lb 6.4 oz (112.7 kg)  Height: 5\' 10"  (1.778 m)   Body mass index is 35.64 kg/m. Gen: WD/WN, NAD Head: [place]/AT, No temporalis wasting.  Ear/Nose/Throat: Hearing grossly intact, nares w/o erythema or drainage, pinna without lesions Eyes: PER, EOMI, sclera nonicteric.  Neck: Supple, no gross masses.  No JVD.  Pulmonary:  Good air movement, no audible wheezing, no use of accessory muscles.  Cardiac: RRR, precordium not hyperdynamic. Vascular:  Large varicosities present, greater than 10 mm bilaterally.  Veins are tender to palpation  Mild venous stasis changes to the legs bilaterally.  Trace soft pitting edema CEAP C3sEpAsPr Vessel Right Left  Radial Palpable Palpable  Gastrointestinal: soft, non-distended. No guarding/no peritoneal signs.  Musculoskeletal: M/S 5/5 throughout.  No deformity.  Neurologic: CN 2-12 intact. Pain and light touch intact in extremities.  Symmetrical.  Speech is fluent. Motor exam as listed above. Psychiatric: Judgment intact, Mood & affect appropriate for pt's clinical situation. Dermatologic: Venous rashes no ulcers noted.  No changes consistent with cellulitis. Lymph : No lichenification or skin changes of chronic lymphedema.  CBC Lab Results  Component Value Date   WBC 7.5 12/31/2022   HGB 13.2 12/31/2022   HCT 41.2 12/31/2022   MCV 84.9 12/31/2022   PLT 287 12/31/2022    BMET    Component Value Date/Time   CREATININE 0.70 08/18/2018 1207   CrCl cannot be calculated (Patient's most recent lab  result is older than the maximum 21 days allowed.).  COAG No results found for: "INR", "PROTIME"  Radiology No results found.   Assessment/Plan 1. Varicose veins with inflammation  Recommend:  The patient has large symptomatic varicose veins that are painful and associated with swelling. The patient is CEAP C4sEpAsPr   I have had a long discussion with the patient regarding  varicose veins and why they cause symptoms.  Patient will begin wearing graduated compression stockings class 1 on a daily basis, beginning first thing in the morning and removing them in the evening. The patient is instructed specifically not to sleep in the stockings.    The patient  will also begin using over-the-counter analgesics such as Motrin 600 mg po TID to help control the symptoms.    In addition, behavioral modification including elevation during the day will be initiated.    Pending the results of these changes the  patient will be reevaluated in three months.   An ultrasound  of the left leg venous system will be obtained.   Further plans will be based on the ultrasound results and whether conservative therapies are successful at eliminating the pain and swelling.  - VAS [place] LOWER EXTREMITY VENOUS REFLUX; Future  2. Chronic venous insufficiency See #1 - VAS [place] LOWER EXTREMITY VENOUS REFLUX; Future  3. Bleeding from varicose veins of lower extremity, right The patient will require sclerotherapy of the right lower extremity to prevent further episodes of hemorrhage.  We will arrange this pending further evaluation of his left lower extremity varicosities.  4. Benign essential hypertension Continue antihypertensive medications as already ordered, these medications have been reviewed and there are no changes at this time.  5. Type 2 diabetes mellitus without complication, without long-term current use of insulin (HCC) Continue hypoglycemic medications as already ordered, these medications have been  reviewed and there are no changes at this time.  Hgb A1C to be monitored as already arranged by primary service    Levora Dredge, MD  03/02/2023 1:00 PM

## 2023-03-11 ENCOUNTER — Ambulatory Visit (INDEPENDENT_AMBULATORY_CARE_PROVIDER_SITE_OTHER): Payer: 59 | Admitting: Vascular Surgery

## 2023-03-11 ENCOUNTER — Encounter (INDEPENDENT_AMBULATORY_CARE_PROVIDER_SITE_OTHER): Payer: 59

## 2023-03-28 ENCOUNTER — Ambulatory Visit (INDEPENDENT_AMBULATORY_CARE_PROVIDER_SITE_OTHER): Payer: 59

## 2023-03-28 ENCOUNTER — Encounter (INDEPENDENT_AMBULATORY_CARE_PROVIDER_SITE_OTHER): Payer: Self-pay | Admitting: Vascular Surgery

## 2023-03-28 ENCOUNTER — Ambulatory Visit (INDEPENDENT_AMBULATORY_CARE_PROVIDER_SITE_OTHER): Payer: 59 | Admitting: Vascular Surgery

## 2023-03-28 VITALS — BP 104/66 | HR 85 | Resp 18 | Ht 70.0 in | Wt 245.4 lb

## 2023-03-28 DIAGNOSIS — I872 Venous insufficiency (chronic) (peripheral): Secondary | ICD-10-CM

## 2023-03-28 DIAGNOSIS — I8310 Varicose veins of unspecified lower extremity with inflammation: Secondary | ICD-10-CM

## 2023-03-28 DIAGNOSIS — E782 Mixed hyperlipidemia: Secondary | ICD-10-CM | POA: Diagnosis not present

## 2023-03-28 DIAGNOSIS — E119 Type 2 diabetes mellitus without complications: Secondary | ICD-10-CM | POA: Diagnosis not present

## 2023-03-28 DIAGNOSIS — I10 Essential (primary) hypertension: Secondary | ICD-10-CM | POA: Diagnosis not present

## 2023-03-28 DIAGNOSIS — I8312 Varicose veins of left lower extremity with inflammation: Secondary | ICD-10-CM

## 2023-03-28 NOTE — Progress Notes (Signed)
MRN : [id number]  Garrett Haley is a 52 y.o. (1971/08/17) male who presents with chief complaint of varicose veins hurt.  History of Present Illness:   The patient returns for followup evaluation 3 months after the initial visit. The patient continues to have pain in the lower extremities with dependency. The pain is lessened with elevation. Graduated compression stockings, Class I (20-30 mmHg), have been worn but the stockings do not eliminate the leg pain. Over-the-counter analgesics do not improve the symptoms. The degree of discomfort continues to interfere with daily activities. The patient notes the pain in the legs is causing problems with daily exercise, at the workplace and even with household activities and maintenance such as standing in the kitchen preparing meals and doing dishes.   Venous ultrasound shows normal deep venous system, no evidence of acute or chronic DVT.  Superficial reflux is present in the left great saphenous vein   Current Meds  Medication Sig   aspirin EC 81 MG tablet Take 81 mg by mouth once.   atorvastatin (LIPITOR) 10 MG tablet Take 10 mg by mouth daily.   clotrimazole-betamethasone (LOTRISONE) cream Apply 1 Application topically 2 (two) times daily.   Continuous Glucose Sensor (GUARDIAN SENSOR 3) MISC Use 1 each every 14 (fourteen) days   furosemide (LASIX) 20 MG tablet Take 20 mg by mouth daily.   glipiZIDE (GLUCOTROL XL) 10 MG 24 hr tablet Take 10 mg by mouth daily with breakfast.   lisinopril-hydrochlorothiazide (ZESTORETIC) 20-12.5 MG tablet Take 1 tablet by mouth daily.   meloxicam (MOBIC) 15 MG tablet Take 15 mg by mouth daily.   metFORMIN (GLUCOPHAGE) 500 MG tablet Take 500 mg by mouth 2 (two) times daily with a meal.   Vitamin D, Ergocalciferol, (DRISDOL) 1.25 MG (50000 UNIT) CAPS capsule Take 50,000 Units by mouth once a week.    Past Medical History:  Diagnosis Date   Hypertension    Sleep apnea    Type 2 diabetes  mellitus without complication (HCC)     Past Surgical History:  Procedure Laterality Date   None      Social History Social History   Tobacco Use   Smoking status: Never    Passive exposure: Never   Smokeless tobacco: Never  Vaping Use   Vaping status: Never Used  Substance Use Topics   Alcohol use: Never   Drug use: Never    Family History Family History  Problem Relation Age of Onset   Heart attack Father    Hypertension Father     No Known Allergies   REVIEW OF SYSTEMS (Negative unless checked)  Constitutional: [] Weight loss  [] Fever  [] Chills Cardiac: [] Chest pain   [] Chest pressure   [] Palpitations   [] Shortness of breath when laying flat   [] Shortness of breath with exertion. Vascular:  [] Pain in legs with walking   [x] Pain in legs with standing  [] History of DVT   [] Phlebitis   [] Swelling in legs   [x] Varicose veins   [] Non-healing ulcers Pulmonary:   [] Uses home oxygen   [] Productive cough   [] Hemoptysis   [] Wheeze  [] COPD   [] Asthma Neurologic:  [] Dizziness   [] Seizures   [] History of stroke   [] History of TIA  [] Aphasia   [] Vissual changes   [] Weakness or numbness in arm   [] Weakness or numbness in leg Musculoskeletal:   [] Joint swelling   [] Joint pain   [] Low back pain Hematologic:  []   Easy bruising  [] Easy bleeding   [] Hypercoagulable state   [] Anemic Gastrointestinal:  [] Diarrhea   [] Vomiting  [] Gastroesophageal reflux/heartburn   [] Difficulty swallowing. Genitourinary:  [] Chronic kidney disease   [] Difficult urination  [] Frequent urination   [] Blood in urine Skin:  [] Rashes   [] Ulcers  Psychological:  [] History of anxiety   []  History of major depression.  Physical Examination  Vitals:   03/28/23 1533  BP: 104/66  Pulse: 85  Resp: 18  Weight: 245 lb 6.4 oz (111.3 kg)  Height: 5\' 10"  (1.778 m)   Body mass index is 35.21 kg/m. Gen: WD/WN, NAD Head: [place]/AT, No temporalis wasting.  Ear/Nose/Throat: Hearing grossly intact, nares w/o erythema or  drainage, pinna without lesions Eyes: PER, EOMI, sclera nonicteric.  Neck: Supple, no gross masses.  No JVD.  Pulmonary:  Good air movement, no audible wheezing, no use of accessory muscles.  Cardiac: RRR, precordium not hyperdynamic. Vascular:  Large varicosities present, greater than 10 mm left leg.  Veins are tender to palpation  Mild venous stasis changes to the legs bilaterally.  Trace soft pitting edema CEAP C3sEpAsPr Vessel Right Left  Radial Palpable Palpable  Gastrointestinal: soft, non-distended. No guarding/no peritoneal signs.  Musculoskeletal: M/S 5/5 throughout.  No deformity.  Neurologic: CN 2-12 intact. Pain and light touch intact in extremities.  Symmetrical.  Speech is fluent. Motor exam as listed above. Psychiatric: Judgment intact, Mood & affect appropriate for pt's clinical situation. Dermatologic: Venous rashes no ulcers noted.  No changes consistent with cellulitis. Lymph : No lichenification or skin changes of chronic lymphedema.  CBC Lab Results  Component Value Date   WBC 7.5 12/31/2022   HGB 13.2 12/31/2022   HCT 41.2 12/31/2022   MCV 84.9 12/31/2022   PLT 287 12/31/2022    BMET    Component Value Date/Time   CREATININE 0.70 08/18/2018 1207   CrCl cannot be calculated (Patient's most recent lab result is older than the maximum 21 days allowed.).  COAG No results found for: "INR", "PROTIME"  Radiology VAS [place] LOWER EXTREMITY VENOUS REFLUX  Result Date: 03/04/2023  Lower Venous Reflux Study Patient Name:  Garrett Haley  Date of Exam:   02/28/2023 Medical Rec #: [id number]             Accession #:    [id number] Date of Birth: 09/20/1970             Patient Gender: M Patient Age:   62 years Exam Location:  [facility] Procedure:      VAS [place] LOWER EXTREMITY VENOUS REFLUX Referring Phys: Sheppard Plumber --------------------------------------------------------------------------------  Indications: Swelling, and right ankle.  Performing  Technologist: Salvadore Farber RVT  Examination Guidelines: A complete evaluation includes B-mode imaging, spectral Doppler, color Doppler, and power Doppler as needed of all accessible portions of each vessel. Bilateral testing is considered an integral part of a complete examination. Limited examinations for reoccurring indications may be performed as noted. The reflux portion of the exam is performed with the patient in reverse Trendelenburg. Significant venous reflux is defined as >500 ms in the superficial venous system, and >1 second in the deep venous system.  Venous Reflux Times +----------+---------+------+-----------+------------+--------+ RIGHT     Reflux NoRefluxReflux TimeDiameter cmsComments                     Yes                                  +----------+---------+------+-----------+------------+--------+  GSV at [facility]          yes    >500 ms      .55              +----------+---------+------+-----------+------------+--------+   Summary: Right: - No evidence of deep vein thrombosis seen in the right lower extremity, from the common femoral through the popliteal veins. - No evidence of superficial venous thrombosis in the right lower extremity. - No evidence of superficial venous reflux seen in the right greater saphenous vein. - No evidence of superficial venous reflux seen in the right short saphenous vein. - Venous reflux is noted in the right sapheno-femoral junction.  *See table(s) above for measurements and observations. Electronically signed by Levora Dredge MD on 03/04/2023 at 11:21:44 AM.    Final      Assessment/Plan 1. Varicose veins with inflammation Recommend  I have reviewed my previous  discussion with the patient regarding  varicose veins and why they cause symptoms. Patient will continue  wearing graduated compression stockings class 1 on a daily basis, beginning first thing in the morning and removing them in the evening.  The patient is CEAP  C3sEpAsPr.  The patient has been wearing compression for more than 12 weeks with no or little benefit.  The patient has been exercising daily for more than 12 weeks. The patient has been elevating and taking OTC pain medications for more than 12 weeks.  None of these have have eliminated the pain related to the varicose veins and venous reflux or the discomfort regarding venous congestion.    In addition, behavioral modification including elevation during the day was again discussed and this will continue.  The patient has utilized over the counter pain medications and has been exercising.  However, at this time conservative therapy has not alleviated the patient's symptoms of leg pain and swelling  Recommend: laser ablation of the left great saphenous vein to eliminate the symptoms of pain and swelling of the lower extremities caused by the severe superficial venous reflux disease.   2. Chronic venous insufficiency No surgery or intervention at this point in time.   The patient is CEAP C4sEpAsPr   I have discussed with the patient venous insufficiency and why it  causes symptoms. I have discussed with the patient the chronic skin changes that accompany venous insufficiency and the long term sequela such as infection and ulceration.  Patient will begin wearing graduated compression stockings or compression wraps on a daily basis.  The patient will put the compression on first thing in the morning and removing them in the evening. The patient is instructed specifically not to sleep in the compression.    In addition, behavioral modification including several periods of elevation of the lower extremities during the day will be continued. I have demonstrated that proper elevation is a position with the ankles at heart level.  3. Benign essential hypertension Continue antihypertensive medications as already ordered, these medications have been reviewed and there are no changes at this time.  4.  Type 2 diabetes mellitus without complication, without long-term current use of insulin (HCC) Continue hypoglycemic medications as already ordered, these medications have been reviewed and there are no changes at this time.  Hgb A1C to be monitored as already arranged by primary service  5. Hyperlipidemia, mixed Continue antihypertensive medications as already ordered, these medications have been reviewed and there are no changes at this time.    Levora Dredge, MD  03/28/2023 4:08 PM

## 2023-04-08 DIAGNOSIS — E1165 Type 2 diabetes mellitus with hyperglycemia: Secondary | ICD-10-CM | POA: Diagnosis not present

## 2023-04-15 DIAGNOSIS — E1165 Type 2 diabetes mellitus with hyperglycemia: Secondary | ICD-10-CM | POA: Diagnosis not present

## 2023-04-15 DIAGNOSIS — E782 Mixed hyperlipidemia: Secondary | ICD-10-CM | POA: Diagnosis not present

## 2023-04-15 DIAGNOSIS — E538 Deficiency of other specified B group vitamins: Secondary | ICD-10-CM | POA: Diagnosis not present

## 2023-04-15 DIAGNOSIS — I10 Essential (primary) hypertension: Secondary | ICD-10-CM | POA: Diagnosis not present

## 2023-05-04 DIAGNOSIS — R600 Localized edema: Secondary | ICD-10-CM | POA: Diagnosis not present

## 2023-05-04 DIAGNOSIS — I8310 Varicose veins of unspecified lower extremity with inflammation: Secondary | ICD-10-CM | POA: Diagnosis not present

## 2023-05-04 DIAGNOSIS — Z1331 Encounter for screening for depression: Secondary | ICD-10-CM | POA: Diagnosis not present

## 2023-05-27 ENCOUNTER — Telehealth (INDEPENDENT_AMBULATORY_CARE_PROVIDER_SITE_OTHER): Payer: Self-pay

## 2023-05-27 NOTE — Telephone Encounter (Signed)
Tried to call to get pt scheduled for laser ablation appt.  No answer LVM for Amit pt's brother to call back to office to schedule this appt.

## 2023-06-04 ENCOUNTER — Telehealth (INDEPENDENT_AMBULATORY_CARE_PROVIDER_SITE_OTHER): Payer: Self-pay

## 2023-06-04 NOTE — Telephone Encounter (Signed)
LVM for someone to call back to office to schedule appt for pt's laser procedure

## 2023-06-06 ENCOUNTER — Other Ambulatory Visit (INDEPENDENT_AMBULATORY_CARE_PROVIDER_SITE_OTHER): Payer: Self-pay

## 2023-06-06 MED ORDER — ALPRAZOLAM 0.5 MG PO TABS: ORAL_TABLET | ORAL | 0 refills | Status: AC

## 2023-07-17 DIAGNOSIS — E1165 Type 2 diabetes mellitus with hyperglycemia: Secondary | ICD-10-CM | POA: Diagnosis not present

## 2023-07-24 DIAGNOSIS — E1165 Type 2 diabetes mellitus with hyperglycemia: Secondary | ICD-10-CM | POA: Diagnosis not present

## 2023-07-24 DIAGNOSIS — E782 Mixed hyperlipidemia: Secondary | ICD-10-CM | POA: Diagnosis not present

## 2023-07-24 DIAGNOSIS — I10 Essential (primary) hypertension: Secondary | ICD-10-CM | POA: Diagnosis not present

## 2023-08-13 DIAGNOSIS — Z008 Encounter for other general examination: Secondary | ICD-10-CM | POA: Diagnosis not present

## 2023-08-13 DIAGNOSIS — Z6837 Body mass index (BMI) 37.0-37.9, adult: Secondary | ICD-10-CM | POA: Diagnosis not present

## 2023-08-13 DIAGNOSIS — I10 Essential (primary) hypertension: Secondary | ICD-10-CM | POA: Diagnosis not present

## 2023-08-13 DIAGNOSIS — Z7984 Long term (current) use of oral hypoglycemic drugs: Secondary | ICD-10-CM | POA: Diagnosis not present

## 2023-08-13 DIAGNOSIS — Z7982 Long term (current) use of aspirin: Secondary | ICD-10-CM | POA: Diagnosis not present

## 2023-08-13 DIAGNOSIS — Z8249 Family history of ischemic heart disease and other diseases of the circulatory system: Secondary | ICD-10-CM | POA: Diagnosis not present

## 2023-08-13 DIAGNOSIS — E559 Vitamin D deficiency, unspecified: Secondary | ICD-10-CM | POA: Diagnosis not present

## 2023-08-13 DIAGNOSIS — E1165 Type 2 diabetes mellitus with hyperglycemia: Secondary | ICD-10-CM | POA: Diagnosis not present

## 2023-08-13 DIAGNOSIS — E785 Hyperlipidemia, unspecified: Secondary | ICD-10-CM | POA: Diagnosis not present

## 2023-08-13 DIAGNOSIS — Z833 Family history of diabetes mellitus: Secondary | ICD-10-CM | POA: Diagnosis not present

## 2023-08-13 DIAGNOSIS — Z7985 Long-term (current) use of injectable non-insulin antidiabetic drugs: Secondary | ICD-10-CM | POA: Diagnosis not present

## 2023-08-13 DIAGNOSIS — I872 Venous insufficiency (chronic) (peripheral): Secondary | ICD-10-CM | POA: Diagnosis not present

## 2023-08-21 NOTE — Progress Notes (Unsigned)
    MRN : [id number]  Garrett Haley is a 52 y.o. ([birth year]) male who presents with chief complaint of No chief complaint on file. .    The patient's left lower extremity was sterilely prepped and draped.  The ultrasound machine was used to visualize the left great saphenous vein throughout its course.  A segment below the knee was selected for access.  The saphenous vein was accessed without difficulty using ultrasound guidance with a micropuncture needle.   An 0.018  wire was placed beyond the saphenofemoral junction through the sheath and the microneedle was removed.  The 65 cm sheath was then placed over the wire and the wire and dilator were removed.  The laser fiber was placed through the sheath and its tip was placed approximately 2 cm below the saphenofemoral junction.  Tumescent anesthesia was then created with a dilute lidocaine solution.  Laser energy was then delivered with constant withdrawal of the sheath and laser fiber.  Approximately 1865 Joules of energy were delivered over a length of 45 cm.  Sterile dressings were placed.  The patient tolerated the procedure well without complications.

## 2023-08-22 ENCOUNTER — Ambulatory Visit (INDEPENDENT_AMBULATORY_CARE_PROVIDER_SITE_OTHER): Payer: 59 | Admitting: Vascular Surgery

## 2023-08-22 ENCOUNTER — Encounter (INDEPENDENT_AMBULATORY_CARE_PROVIDER_SITE_OTHER): Payer: Self-pay | Admitting: Vascular Surgery

## 2023-08-22 VITALS — BP 137/75 | HR 69 | Resp 18 | Ht 70.0 in | Wt 245.0 lb

## 2023-08-22 DIAGNOSIS — I8312 Varicose veins of left lower extremity with inflammation: Secondary | ICD-10-CM

## 2023-08-22 DIAGNOSIS — I8310 Varicose veins of unspecified lower extremity with inflammation: Secondary | ICD-10-CM

## 2023-08-27 ENCOUNTER — Other Ambulatory Visit (INDEPENDENT_AMBULATORY_CARE_PROVIDER_SITE_OTHER): Payer: Self-pay | Admitting: Vascular Surgery

## 2023-08-27 DIAGNOSIS — I8310 Varicose veins of unspecified lower extremity with inflammation: Secondary | ICD-10-CM

## 2023-08-29 ENCOUNTER — Ambulatory Visit (INDEPENDENT_AMBULATORY_CARE_PROVIDER_SITE_OTHER): Payer: 59

## 2023-08-29 DIAGNOSIS — I8310 Varicose veins of unspecified lower extremity with inflammation: Secondary | ICD-10-CM

## 2023-08-29 DIAGNOSIS — I8312 Varicose veins of left lower extremity with inflammation: Secondary | ICD-10-CM

## 2023-09-19 ENCOUNTER — Ambulatory Visit (INDEPENDENT_AMBULATORY_CARE_PROVIDER_SITE_OTHER): Payer: 59 | Admitting: Vascular Surgery

## 2023-09-24 NOTE — Progress Notes (Signed)
MRN : [id number]  Garrett Haley is a 53 y.o. (1971/03/17) male who presents with chief complaint of varicose veins hurt.  History of Present Illness:   The patient returns to the office for followup status post laser ablation of the left great saphenous vein on 08/22/2023.  The patient note significant improvement in the lower extremity pain but not resolution of the symptoms. The patient notes multiple residual varicosities bilaterally which continued to hurt with dependent positions and remained tender to palpation. The patient's swelling is minimally from preoperative status. The patient continues to wear graduated compression stockings on a daily basis but these are not eliminating the pain and discomfort. The patient continues to use over-the-counter anti-inflammatory medications to treat the pain and related symptoms but this has not given the patient relief. The patient notes the pain in the lower extremities is causing problems with daily exercise, problems at work and even with household activities such as preparing meals and doing dishes.  The patient is otherwise done well and there have been no complications related to the laser procedure or interval changes in the patient's overall   Post laser ultrasound 08/29/2023 shows successful ablation of the left great saphenous vein   No outpatient medications have been marked as taking for the 09/26/23 encounter (Appointment) with Gilda Crease, Latina Craver, MD.    Past Medical History:  Diagnosis Date   Hypertension    Sleep apnea    Type 2 diabetes mellitus without complication (HCC)     Past Surgical History:  Procedure Laterality Date   None      Social History Social History   Tobacco Use   Smoking status: Never    Passive exposure: Never   Smokeless tobacco: Never  Vaping Use   Vaping status: Never Used  Substance Use Topics   Alcohol use: Never   Drug use: Never    Family History Family History   Problem Relation Age of Onset   Heart attack Father    Hypertension Father     No Known Allergies   REVIEW OF SYSTEMS (Negative unless checked)  Constitutional: [] Weight loss  [] Fever  [] Chills Cardiac: [] Chest pain   [] Chest pressure   [] Palpitations   [] Shortness of breath when laying flat   [] Shortness of breath with exertion. Vascular:  [] Pain in legs with walking   [x] Pain in legs with standing  [] History of DVT   [] Phlebitis   [] Swelling in legs   [x] Varicose veins   [] Non-healing ulcers Pulmonary:   [] Uses home oxygen   [] Productive cough   [] Hemoptysis   [] Wheeze  [] COPD   [] Asthma Neurologic:  [] Dizziness   [] Seizures   [] History of stroke   [] History of TIA  [] Aphasia   [] Vissual changes   [] Weakness or numbness in arm   [] Weakness or numbness in leg Musculoskeletal:   [] Joint swelling   [] Joint pain   [] Low back pain Hematologic:  [] Easy bruising  [] Easy bleeding   [] Hypercoagulable state   [] Anemic Gastrointestinal:  [] Diarrhea   [] Vomiting  [] Gastroesophageal reflux/heartburn   [] Difficulty swallowing. Genitourinary:  [] Chronic kidney disease   [] Difficult urination  [] Frequent urination   [] Blood in urine Skin:  [] Rashes   [] Ulcers  Psychological:  [] History of anxiety   []  History of major depression.  Physical Examination  There were no vitals filed for this visit. There is no height or weight on file to calculate BMI. Gen: WD/WN, NAD  Head: [place]/AT, No temporalis wasting.  Ear/Nose/Throat: Hearing grossly intact, nares w/o erythema or drainage, pinna without lesions Eyes: PER, EOMI, sclera nonicteric.  Neck: Supple, no gross masses.  No JVD.  Pulmonary:  Good air movement, no audible wheezing, no use of accessory muscles.  Cardiac: RRR, precordium not hyperdynamic. Vascular:  Large varicosities present, greater than 10 mm left leg.  Veins are tender to palpation  Mild venous stasis changes to the legs bilaterally.  Trace soft pitting edema CEAP C3sEpAsPr Vessel Right  Left  Radial Palpable Palpable  Gastrointestinal: soft, non-distended. No guarding/no peritoneal signs.  Musculoskeletal: M/S 5/5 throughout.  No deformity.  Neurologic: CN 2-12 intact. Pain and light touch intact in extremities.  Symmetrical.  Speech is fluent. Motor exam as listed above. Psychiatric: Judgment intact, Mood & affect appropriate for pt's clinical situation. Dermatologic: Venous rashes no ulcers noted.  No changes consistent with cellulitis. Lymph : No lichenification or skin changes of chronic lymphedema.  CBC Lab Results  Component Value Date   WBC 7.5 12/31/2022   HGB 13.2 12/31/2022   HCT 41.2 12/31/2022   MCV 84.9 12/31/2022   PLT 287 12/31/2022    BMET    Component Value Date/Time   CREATININE 0.70 08/18/2018 1207   CrCl cannot be calculated (Patient's most recent lab result is older than the maximum 21 days allowed.).  COAG No results found for: "INR", "PROTIME"  Radiology VAS [place] LOWER EXTREMITY VENOUS POST ABLATION Result Date: 08/29/2023  Lower Venous Reflux Study Patient Name:  Garrett Haley  Date of Exam:   08/29/2023 Medical Rec #: [id number]             Accession #:    [id number] Date of Birth: Jul 17, 1971             Patient Gender: M Patient Age:   14 years Exam Location:  [facility] Procedure:      VAS [place] LOWER EXTREMITY VENOUS POST ABLATION Referring Phys: Earl Lites Shalah Estelle --------------------------------------------------------------------------------  Other Indications: Left GSV ablation. Performing Technologist: Salvadore Farber RVT  Examination Guidelines: A complete evaluation includes B-mode imaging, spectral Doppler, color Doppler, and power Doppler as needed of all accessible portions of each vessel. Bilateral testing is considered an integral part of a complete examination. Limited examinations for reoccurring indications may be performed as noted. The reflux portion of the exam is performed with the patient in reverse  Trendelenburg. Significant venous reflux is defined as >500 ms in the superficial venous system, and >1 second in the deep venous system.   Summary: Left: - No evidence of deep vein thrombosis seen in the left lower extremity, from the common femoral through the popliteal veins. - Left GSV closed post ablation  *See table(s) above for measurements and observations. Electronically signed by Levora Dredge MD on 08/29/2023 at 4:16:39 PM.    Final      Assessment/Plan 1. Varicose veins with inflammation (Primary) Recommend:  The patient has had successful ablation of the previously incompetent left great saphenous venous system but still has persistent symptoms of pain and swelling that are having a negative impact on daily life and daily activities.  Patient should undergo injection sclerotherapy to treat the residual varicosities.  The risks, benefits and alternative therapies were reviewed in detail with the patient.  All questions were answered.  The patient agrees to proceed with sclerotherapy at their convenience.  The patient will continue wearing the graduated compression stockings and using the over-the-counter pain medications to treat her  symptoms.      2. Chronic venous insufficiency Recommend:  No surgery or intervention at this point in time.  I have reviewed my discussion with the patient regarding venous insufficiency and why it causes symptoms. I have discussed with the patient the chronic skin changes that accompany venous insufficiency and the long term sequela such as ulceration. Patient will contnue wearing graduated compression stockings on a daily basis, as this has provided excellent control of his edema. The patient will put the stockings on first thing in the morning and removing them in the evening. The patient is reminded not to sleep in the stockings.  In addition, behavioral modification including elevation during the day will be initiated. Exercise is strongly  encouraged.  Previous duplex ultrasound of the lower extremities shows normal deep system, no significant superficial reflux was identified.  3. Benign essential hypertension Continue antihypertensive medications as already ordered, these medications have been reviewed and there are no changes at this time.  4. Type 2 diabetes mellitus without complication, without long-term current use of insulin (HCC) Continue hypoglycemic medications as already ordered, these medications have been reviewed and there are no changes at this time.  Hgb A1C to be monitored as already arranged by primary service  5. Hyperlipidemia, mixed Continue statin as ordered and reviewed, no changes at this time    Levora Dredge, MD  09/24/2023 9:49 AM

## 2023-09-26 ENCOUNTER — Encounter (INDEPENDENT_AMBULATORY_CARE_PROVIDER_SITE_OTHER): Payer: Self-pay | Admitting: Vascular Surgery

## 2023-09-26 ENCOUNTER — Ambulatory Visit (INDEPENDENT_AMBULATORY_CARE_PROVIDER_SITE_OTHER): Payer: 59 | Admitting: Vascular Surgery

## 2023-09-26 VITALS — BP 119/78 | HR 73 | Resp 18 | Ht 70.0 in | Wt 240.0 lb

## 2023-09-26 DIAGNOSIS — E119 Type 2 diabetes mellitus without complications: Secondary | ICD-10-CM

## 2023-09-26 DIAGNOSIS — E782 Mixed hyperlipidemia: Secondary | ICD-10-CM

## 2023-09-26 DIAGNOSIS — I10 Essential (primary) hypertension: Secondary | ICD-10-CM | POA: Diagnosis not present

## 2023-09-26 DIAGNOSIS — I8310 Varicose veins of unspecified lower extremity with inflammation: Secondary | ICD-10-CM | POA: Diagnosis not present

## 2023-09-26 DIAGNOSIS — I872 Venous insufficiency (chronic) (peripheral): Secondary | ICD-10-CM

## 2023-10-17 DIAGNOSIS — E1165 Type 2 diabetes mellitus with hyperglycemia: Secondary | ICD-10-CM | POA: Diagnosis not present

## 2023-10-24 DIAGNOSIS — K429 Umbilical hernia without obstruction or gangrene: Secondary | ICD-10-CM | POA: Diagnosis not present

## 2023-10-24 DIAGNOSIS — I10 Essential (primary) hypertension: Secondary | ICD-10-CM | POA: Diagnosis not present

## 2023-10-24 DIAGNOSIS — Z125 Encounter for screening for malignant neoplasm of prostate: Secondary | ICD-10-CM | POA: Diagnosis not present

## 2023-10-24 DIAGNOSIS — E1100 Type 2 diabetes mellitus with hyperosmolarity without nonketotic hyperglycemic-hyperosmolar coma (NKHHC): Secondary | ICD-10-CM | POA: Diagnosis not present

## 2023-10-24 DIAGNOSIS — E782 Mixed hyperlipidemia: Secondary | ICD-10-CM | POA: Diagnosis not present

## 2023-10-24 DIAGNOSIS — E538 Deficiency of other specified B group vitamins: Secondary | ICD-10-CM | POA: Diagnosis not present

## 2023-10-24 DIAGNOSIS — E559 Vitamin D deficiency, unspecified: Secondary | ICD-10-CM | POA: Diagnosis not present

## 2023-10-24 DIAGNOSIS — I83893 Varicose veins of bilateral lower extremities with other complications: Secondary | ICD-10-CM | POA: Diagnosis not present

## 2023-10-24 DIAGNOSIS — E1165 Type 2 diabetes mellitus with hyperglycemia: Secondary | ICD-10-CM | POA: Diagnosis not present

## 2023-11-05 DIAGNOSIS — Z01818 Encounter for other preprocedural examination: Secondary | ICD-10-CM | POA: Diagnosis not present

## 2023-11-05 DIAGNOSIS — Z1211 Encounter for screening for malignant neoplasm of colon: Secondary | ICD-10-CM | POA: Diagnosis not present

## 2023-11-29 ENCOUNTER — Encounter (INDEPENDENT_AMBULATORY_CARE_PROVIDER_SITE_OTHER): Payer: Self-pay | Admitting: Nurse Practitioner

## 2023-11-29 ENCOUNTER — Ambulatory Visit (INDEPENDENT_AMBULATORY_CARE_PROVIDER_SITE_OTHER): Admitting: Nurse Practitioner

## 2023-11-29 VITALS — BP 128/80 | HR 64 | Resp 16 | Wt 241.0 lb

## 2023-11-29 DIAGNOSIS — I8312 Varicose veins of left lower extremity with inflammation: Secondary | ICD-10-CM

## 2023-11-29 DIAGNOSIS — I8310 Varicose veins of unspecified lower extremity with inflammation: Secondary | ICD-10-CM

## 2023-11-30 ENCOUNTER — Encounter (INDEPENDENT_AMBULATORY_CARE_PROVIDER_SITE_OTHER): Payer: Self-pay | Admitting: Nurse Practitioner

## 2023-11-30 NOTE — Progress Notes (Signed)
Varicose veins of left lower extremity with inflammation (454.1  I83.10) Current Plans   Indication: Patient presents with symptomatic varicose veins of the left lower extremity.   Procedure: Sclerotherapy using hypertonic saline mixed with 1% Lidocaine was performed on the left lower extremity. Compression wraps were placed. The patient tolerated the procedure well. 

## 2023-12-25 ENCOUNTER — Encounter (INDEPENDENT_AMBULATORY_CARE_PROVIDER_SITE_OTHER): Payer: Self-pay | Admitting: Nurse Practitioner

## 2023-12-25 ENCOUNTER — Ambulatory Visit (INDEPENDENT_AMBULATORY_CARE_PROVIDER_SITE_OTHER): Admitting: Nurse Practitioner

## 2023-12-25 VITALS — BP 165/80 | HR 91 | Resp 18

## 2023-12-25 DIAGNOSIS — I8312 Varicose veins of left lower extremity with inflammation: Secondary | ICD-10-CM | POA: Diagnosis not present

## 2023-12-25 DIAGNOSIS — I8310 Varicose veins of unspecified lower extremity with inflammation: Secondary | ICD-10-CM

## 2023-12-25 NOTE — Progress Notes (Signed)
Varicose veins of left lower extremity with inflammation (454.1  I83.10) Current Plans   Indication: Patient presents with symptomatic varicose veins of the left lower extremity.   Procedure: Sclerotherapy using hypertonic saline mixed with 1% Lidocaine was performed on the left lower extremity. Compression wraps were placed. The patient tolerated the procedure well. 

## 2023-12-26 ENCOUNTER — Ambulatory Visit (INDEPENDENT_AMBULATORY_CARE_PROVIDER_SITE_OTHER): Admitting: Nurse Practitioner

## 2024-01-15 DIAGNOSIS — E538 Deficiency of other specified B group vitamins: Secondary | ICD-10-CM | POA: Diagnosis not present

## 2024-01-15 DIAGNOSIS — Z125 Encounter for screening for malignant neoplasm of prostate: Secondary | ICD-10-CM | POA: Diagnosis not present

## 2024-01-15 DIAGNOSIS — E559 Vitamin D deficiency, unspecified: Secondary | ICD-10-CM | POA: Diagnosis not present

## 2024-01-15 DIAGNOSIS — E1165 Type 2 diabetes mellitus with hyperglycemia: Secondary | ICD-10-CM | POA: Diagnosis not present

## 2024-01-20 ENCOUNTER — Ambulatory Visit: Admitting: Registered Nurse

## 2024-01-20 ENCOUNTER — Ambulatory Visit
Admission: RE | Admit: 2024-01-20 | Discharge: 2024-01-20 | Disposition: A | Discharge status: Home / Self Care | Payer: 59 | Attending: Gastroenterology | Admitting: Gastroenterology

## 2024-01-20 ENCOUNTER — Other Ambulatory Visit: Payer: Self-pay

## 2024-01-20 ENCOUNTER — Encounter
Admission: RE | Disposition: A | Discharge status: Home / Self Care | Payer: Self-pay | Source: Home / Self Care | Attending: Gastroenterology

## 2024-01-20 ENCOUNTER — Encounter: Payer: Self-pay | Admitting: *Deleted

## 2024-01-20 DIAGNOSIS — E119 Type 2 diabetes mellitus without complications: Secondary | ICD-10-CM | POA: Diagnosis not present

## 2024-01-20 DIAGNOSIS — Z1211 Encounter for screening for malignant neoplasm of colon: Secondary | ICD-10-CM | POA: Diagnosis not present

## 2024-01-20 DIAGNOSIS — G4730 Sleep apnea, unspecified: Secondary | ICD-10-CM | POA: Diagnosis not present

## 2024-01-20 DIAGNOSIS — K640 First degree hemorrhoids: Secondary | ICD-10-CM | POA: Diagnosis not present

## 2024-01-20 DIAGNOSIS — Z7984 Long term (current) use of oral hypoglycemic drugs: Secondary | ICD-10-CM | POA: Diagnosis not present

## 2024-01-20 DIAGNOSIS — Z7985 Long-term (current) use of injectable non-insulin antidiabetic drugs: Secondary | ICD-10-CM | POA: Insufficient documentation

## 2024-01-20 DIAGNOSIS — I10 Essential (primary) hypertension: Secondary | ICD-10-CM | POA: Insufficient documentation

## 2024-01-20 DIAGNOSIS — K648 Other hemorrhoids: Secondary | ICD-10-CM | POA: Diagnosis not present

## 2024-01-20 HISTORY — PX: COLONOSCOPY WITH PROPOFOL: SHX5780

## 2024-01-20 LAB — GLUCOSE, CAPILLARY: Glucose-Capillary: 144 mg/dL — ABNORMAL HIGH (ref 70–99)

## 2024-01-20 SURGERY — COLONOSCOPY WITH PROPOFOL
Anesthesia: General

## 2024-01-20 MED ORDER — PROPOFOL 10 MG/ML IV BOLUS
INTRAVENOUS | Status: DC | PRN
  Administered 2024-01-20: 100 mg via INTRAVENOUS

## 2024-01-20 MED ORDER — DEXMEDETOMIDINE HCL IN NACL 80 MCG/20ML IV SOLN
INTRAVENOUS | Status: DC | PRN
  Administered 2024-01-20: 12 ug via INTRAVENOUS

## 2024-01-20 MED ORDER — PROPOFOL 500 MG/50ML IV EMUL
INTRAVENOUS | Status: DC | PRN
  Administered 2024-01-20: 120 ug/kg/min via INTRAVENOUS

## 2024-01-20 MED ORDER — SODIUM CHLORIDE 0.9 % IV SOLN: INTRAVENOUS | Status: DC

## 2024-01-20 MED ORDER — LIDOCAINE HCL (CARDIAC) PF 100 MG/5ML IV SOSY
PREFILLED_SYRINGE | INTRAVENOUS | Status: DC | PRN
  Administered 2024-01-20: 50 mg via INTRAVENOUS

## 2024-01-20 NOTE — Anesthesia Preprocedure Evaluation (Signed)
 Anesthesia Evaluation  Patient identified by MRN, date of birth, ID band Patient awake    Reviewed: Allergy & Precautions, NPO status , Patient's Chart, lab work & pertinent test results  History of Anesthesia Complications Negative for: history of anesthetic complications  Airway Mallampati: II  TM Distance: >3 FB Neck ROM: Full    Dental no notable dental hx. (+) Teeth Intact   Pulmonary sleep apnea , neg COPD, Patient abstained from smoking.Not current smoker   Pulmonary exam normal breath sounds clear to auscultation       Cardiovascular Exercise Tolerance: Good METShypertension, Pt. on medications (-) CAD and (-) Past MI (-) dysrhythmias  Rhythm:Regular Rate:Normal - Systolic murmurs    Neuro/Psych negative neurological ROS  negative psych ROS   GI/Hepatic ,neg GERD  ,,(+)     (-) substance abuse    Endo/Other  diabetes, Well Controlled  Last GLP1 agonist taken 2 weeks ago. Denies GI symptoms today  Renal/GU negative Renal ROS     Musculoskeletal   Abdominal  (+) + obese  Peds  Hematology   Anesthesia Other Findings Past Medical History: No date: Hypertension No date: Sleep apnea No date: Type 2 diabetes mellitus without complication (HCC)  Reproductive/Obstetrics                             Anesthesia Physical Anesthesia Plan  ASA: 2  Anesthesia Plan: General   Post-op Pain Management: Minimal or no pain anticipated   Induction: Intravenous  PONV Risk Score and Plan: 2 and Propofol infusion, TIVA and Ondansetron  Airway Management Planned: Nasal Cannula  Additional Equipment: None  Intra-op Plan:   Post-operative Plan:   Informed Consent: I have reviewed the patients History and Physical, chart, labs and discussed the procedure including the risks, benefits and alternatives for the proposed anesthesia with the patient or authorized representative who has indicated  his/her understanding and acceptance.     Dental advisory given (Patient declined interpreter use; requested his brother-in-law translate)  Plan Discussed with: CRNA and Surgeon  Anesthesia Plan Comments: (Discussed risks of anesthesia with patient, including possibility of difficulty with spontaneous ventilation under anesthesia necessitating airway intervention, PONV, and rare risks such as cardiac or respiratory or neurological events, and allergic reactions. Discussed the role of CRNA in patient's perioperative care. Patient understands.)       Anesthesia Quick Evaluation

## 2024-01-20 NOTE — Anesthesia Procedure Notes (Signed)
 Procedure Name: MAC Date/Time: 01/20/2024 9:45 AM  Performed by: Marisue Side, CRNAPre-anesthesia Checklist: Patient identified, Emergency Drugs available, Suction available and Patient being monitored Patient Re-evaluated:Patient Re-evaluated prior to induction Oxygen Delivery Method: Nasal cannula Preoxygenation: Pre-oxygenation with 100% oxygen Induction Type: IV induction

## 2024-01-20 NOTE — Anesthesia Postprocedure Evaluation (Signed)
 Anesthesia Post Note  Patient: Garrett Haley  Procedure(s) Performed: COLONOSCOPY WITH PROPOFOL  Patient location during evaluation: Endoscopy Anesthesia Type: General Level of consciousness: awake and alert Pain management: pain level controlled Vital Signs Assessment: post-procedure vital signs reviewed and stable Respiratory status: spontaneous breathing, nonlabored ventilation, respiratory function stable and patient connected to nasal cannula oxygen Cardiovascular status: blood pressure returned to baseline and stable Postop Assessment: no apparent nausea or vomiting Anesthetic complications: no   No notable events documented.   Last Vitals:  Vitals:   01/20/24 1010 01/20/24 1020  BP: 106/60 118/82  Pulse: 90 76  Resp: 18 18  Temp:    SpO2: 99% 100%    Last Pain:  Vitals:   01/20/24 1020  TempSrc:   PainSc: 0-No pain                 Lattie Poli

## 2024-01-20 NOTE — Interval H&P Note (Signed)
 History and Physical Interval Note:  01/20/2024 9:23 AM  Garrett Haley  has presented today for surgery, with the diagnosis of CCASCREEN.  The various methods of treatment have been discussed with the patient and family. After consideration of risks, benefits and other options for treatment, the patient has consented to  Procedure(s) with comments: COLONOSCOPY WITH PROPOFOL (N/A) - GUJARATI  INTERPRETER - DM as a surgical intervention.  The patient's history has been reviewed, patient examined, no change in status, stable for surgery.  I have reviewed the patient's chart and labs.  Questions were answered to the patient's satisfaction.     Shane Darling  Ok to proceed with colonoscopy

## 2024-01-20 NOTE — H&P (Signed)
 Outpatient short stay form Pre-procedure 01/20/2024  Shane Darling, MD  Primary Physician: Rex Castor, MD  Reason for visit:  Screening  History of present illness:    53 y/o gentleman with history of HLD, DM II, and sleep apnea here for index screening colonoscopy. No blood thinners. No family history of GI malignancies. No significant abdominal surgeries.    Current Facility-Administered Medications:    0.9 %  sodium chloride infusion, , Intravenous, Continuous, Tiyah Zelenak, Leanora Prophet, MD  Medications Prior to Admission  Medication Sig Dispense Refill Last Dose/Taking   furosemide (LASIX) 20 MG tablet Take 20 mg by mouth daily.   Past Month   ALPRAZolam  (XANAX ) 0.5 MG tablet Take 1 tab one hour before procedure and 1 tab when you arrive in office (Patient not taking: Reported on 12/25/2023) 2 tablet 0    aspirin EC 81 MG tablet Take 81 mg by mouth once.   01/18/2024   atorvastatin (LIPITOR) 10 MG tablet Take 10 mg by mouth daily.   01/18/2024   clotrimazole-betamethasone (LOTRISONE) cream Apply 1 Application topically 2 (two) times daily.      Continuous Glucose Sensor (GUARDIAN SENSOR 3) MISC Use 1 each every 14 (fourteen) days      glipiZIDE (GLUCOTROL XL) 10 MG 24 hr tablet Take 10 mg by mouth daily with breakfast.   01/18/2024   lisinopril-hydrochlorothiazide (ZESTORETIC) 20-12.5 MG tablet Take 1 tablet by mouth daily.   01/18/2024   meloxicam (MOBIC) 15 MG tablet Take 15 mg by mouth daily.   01/18/2024   metFORMIN (GLUCOPHAGE) 500 MG tablet Take 500 mg by mouth 2 (two) times daily with a meal.   01/18/2024   TRULICITY 3 MG/0.5ML SOAJ SMARTSIG:0.5 Milliliter(s) SUB-Q Once a Week   01/06/2024   Vitamin D, Ergocalciferol, (DRISDOL) 1.25 MG (50000 UNIT) CAPS capsule Take 50,000 Units by mouth once a week.   01/18/2024     No Known Allergies   Past Medical History:  Diagnosis Date   Hypertension    Sleep apnea    Type 2 diabetes mellitus without complication (HCC)      Review of systems:  Otherwise negative.    Physical Exam  Gen: Alert, oriented. Appears stated age.  HEENT: PERRLA. Lungs: No respiratory distress CV: RRR Abd: soft, benign, no masses Ext: No edema    Planned procedures: Proceed with colonoscopy. The patient understands the nature of the planned procedure, indications, risks, alternatives and potential complications including but not limited to bleeding, infection, perforation, damage to internal organs and possible oversedation/side effects from anesthesia. The patient agrees and gives consent to proceed.  Please refer to procedure notes for findings, recommendations and patient disposition/instructions.     Shane Darling, MD [facility]

## 2024-01-20 NOTE — Op Note (Signed)
 [facility] Patient Name: Garrett Haley Procedure Date: 01/20/2024 9:20 AM MRN: [id number] Account #: [id number] Date of Birth: 1970/09/29 Admit Type: Outpatient Age: 53 Room: [facility] ENDO ROOM 3 Gender: Male Note Status: Finalized Instrument Name: Charlyn Cooley [id number] Procedure:             Colonoscopy Indications:           Screening for colorectal malignant neoplasm Providers:             Leida Puna MD, MD Referring MD:          Rex Castor, MD (Referring MD) Medicines:             Monitored Anesthesia Care Complications:         No immediate complications. Procedure:             Pre-Anesthesia Assessment:                        - Prior to the procedure, a History and Physical was                         performed, and patient medications and allergies were                         reviewed. The patient is competent. The risks and                         benefits of the procedure and the sedation options and                         risks were discussed with the patient. All questions                         were answered and informed consent was obtained.                         Patient identification and proposed procedure were                         verified by the physician, the nurse, the                         anesthesiologist, the anesthetist and the technician                         in the endoscopy suite. Mental Status Examination:                         alert and oriented. Airway Examination: normal                         oropharyngeal airway and neck mobility. Respiratory                         Examination: clear to auscultation. CV Examination:                         normal. Prophylactic Antibiotics: The patient does not  require prophylactic antibiotics. Prior                         Anticoagulants: The patient has taken no anticoagulant                         or antiplatelet agents. ASA  Grade Assessment: II - A                         patient with mild systemic disease. After reviewing                         the risks and benefits, the patient was deemed in                         satisfactory condition to undergo the procedure. The                         anesthesia plan was to use monitored anesthesia care                         (MAC). Immediately prior to administration of                         medications, the patient was re-assessed for adequacy                         to receive sedatives. The heart rate, respiratory                         rate, oxygen saturations, blood pressure, adequacy of                         pulmonary ventilation, and response to care were                         monitored throughout the procedure. The physical                         status of the patient was re-assessed after the                         procedure.                        After obtaining informed consent, the colonoscope was                         passed under direct vision. Throughout the procedure,                         the patient's blood pressure, pulse, and oxygen                         saturations were monitored continuously. The                         Colonoscope was introduced through the anus and  advanced to the the terminal ileum, with                         identification of the appendiceal orifice and IC                         valve. The colonoscopy was performed without                         difficulty. The patient tolerated the procedure well.                         The quality of the bowel preparation was good. The                         terminal ileum, ileocecal valve, appendiceal orifice,                         and rectum were photographed. Findings:      The perianal and digital rectal examinations were normal.      The terminal ileum appeared normal.      Internal hemorrhoids were found during retroflexion. The  hemorrhoids       were Grade I (internal hemorrhoids that do not prolapse).      The exam was otherwise without abnormality on direct and retroflexion       views. Impression:            - The examined portion of the ileum was normal.                        - Internal hemorrhoids.                        - The examination was otherwise normal on direct and                         retroflexion views.                        - No specimens collected. Recommendation:        - Discharge patient to home.                        - Resume previous diet.                        - Continue present medications.                        - Repeat colonoscopy in 10 years for screening                         purposes.                        - Return to referring physician as previously                         scheduled. Procedure Code(s):     --- Professional ---  Z6109, Colorectal cancer screening; colonoscopy on                         individual not meeting criteria for high risk Diagnosis Code(s):     --- Professional ---                        Z12.11, Encounter for screening for malignant neoplasm                         of colon                        K64.0, First degree hemorrhoids CPT copyright 2022 American [employer] Association. All rights reserved. The codes documented in this report are preliminary and upon coder review may  be revised to meet current compliance requirements. Leida Puna MD, MD 01/20/2024 10:07:50 AM Number of Addenda: 0 Note Initiated On: 01/20/2024 9:20 AM Scope Withdrawal Time: 0 hours 8 minutes 9 seconds  Total Procedure Duration: 0 hours 11 minutes 25 seconds  Estimated Blood Loss:  Estimated blood loss: none.      [facility]

## 2024-01-20 NOTE — Transfer of Care (Signed)
 Immediate Anesthesia Transfer of Care Note  Patient: Garrett Haley  Procedure(s) Performed: COLONOSCOPY WITH PROPOFOL  Patient Location: Endoscopy Unit  Anesthesia Type:General  Level of Consciousness: drowsy and patient cooperative  Airway & Oxygen Therapy: Patient Spontanous Breathing  Post-op Assessment: Report given to RN and Post -op Vital signs reviewed and stable  Post vital signs: Reviewed and stable  Last Vitals:  Vitals Value Taken Time  BP 111/76 01/20/24 1002  Temp 36.1 C 01/20/24 1000  Pulse 88 01/20/24 1002  Resp 16 01/20/24 1001  SpO2 98 % 01/20/24 1002  Vitals shown include unfiled device data.  Last Pain:  Vitals:   01/20/24 1000  TempSrc: Tympanic  PainSc: Asleep         Complications: No notable events documented.

## 2024-01-22 ENCOUNTER — Encounter (INDEPENDENT_AMBULATORY_CARE_PROVIDER_SITE_OTHER): Payer: Self-pay | Admitting: Nurse Practitioner

## 2024-01-22 ENCOUNTER — Ambulatory Visit (INDEPENDENT_AMBULATORY_CARE_PROVIDER_SITE_OTHER): Admitting: Nurse Practitioner

## 2024-01-22 VITALS — BP 156/82 | HR 72 | Resp 16 | Wt 240.0 lb

## 2024-01-22 DIAGNOSIS — I8310 Varicose veins of unspecified lower extremity with inflammation: Secondary | ICD-10-CM

## 2024-01-22 DIAGNOSIS — I8312 Varicose veins of left lower extremity with inflammation: Secondary | ICD-10-CM

## 2024-01-23 ENCOUNTER — Encounter (INDEPENDENT_AMBULATORY_CARE_PROVIDER_SITE_OTHER): Payer: Self-pay | Admitting: Nurse Practitioner

## 2024-01-23 NOTE — Progress Notes (Signed)
Varicose veins of left lower extremity with inflammation (454.1  I83.10) Current Plans   Indication: Patient presents with symptomatic varicose veins of the left lower extremity.   Procedure: Sclerotherapy using hypertonic saline mixed with 1% Lidocaine was performed on the left lower extremity. Compression wraps were placed. The patient tolerated the procedure well. 

## 2024-01-28 ENCOUNTER — Encounter (INDEPENDENT_AMBULATORY_CARE_PROVIDER_SITE_OTHER): Payer: Self-pay

## 2024-03-04 ENCOUNTER — Ambulatory Visit (INDEPENDENT_AMBULATORY_CARE_PROVIDER_SITE_OTHER): Admitting: Nurse Practitioner

## 2024-03-04 ENCOUNTER — Encounter (INDEPENDENT_AMBULATORY_CARE_PROVIDER_SITE_OTHER): Payer: Self-pay | Admitting: Nurse Practitioner

## 2024-03-04 VITALS — BP 128/80 | HR 68 | Ht 70.0 in | Wt 236.0 lb

## 2024-03-04 DIAGNOSIS — I8310 Varicose veins of unspecified lower extremity with inflammation: Secondary | ICD-10-CM | POA: Diagnosis not present

## 2024-03-04 DIAGNOSIS — I10 Essential (primary) hypertension: Secondary | ICD-10-CM

## 2024-03-04 NOTE — Progress Notes (Signed)
 Subjective:    Patient ID: Garrett Haley, male    DOB: 07-Nov-1970, 53 y.o.   MRN: [id number] Chief Complaint  Patient presents with   Follow-up    - fu 6-8 weeks no studies see FB     The patient returns to the office for followup status post laser ablation of the left great saphenous vein on 08/22/2023.  The patient note significant improvement in the lower extremity pain but not resolution of the symptoms. The patient notes multiple residual varicosities bilaterally which continued to hurt with dependent positions and remained tender to palpation. The patient's swelling is minimally from preoperative status. The patient continues to wear graduated compression stockings on a daily basis but these are not eliminating the pain and discomfort. The patient continues to use over-the-counter anti-inflammatory medications to treat the pain and related symptoms but this has not given the patient relief. The patient notes the pain in the lower extremities is causing problems with daily exercise, problems at work and even with household activities such as preparing meals and doing dishes.  He has also had a previous history of spontaneous bleeding on the varicosities.   The patient is otherwise done well and there have been no complications related to the laser procedure or interval changes in the patient's overall    Post laser ultrasound 08/29/2023 shows successful ablation of the left great saphenous vein      Review of Systems  Cardiovascular:  Positive for leg swelling.  All other systems reviewed and are negative.      Objective:   Physical Exam Vitals reviewed.  HENT:     Head: Normocephalic.   Cardiovascular:     Rate and Rhythm: Normal rate.     Pulses: Normal pulses.  Pulmonary:     Effort: Pulmonary effort is normal.   Musculoskeletal:        General: Tenderness present.   Skin:    General: Skin is warm and dry.   Neurological:     Mental Status: He is alert and  oriented to person, place, and time.   Psychiatric:        Mood and Affect: Mood normal.        Behavior: Behavior normal.        Thought Content: Thought content normal.        Judgment: Judgment normal.     BP 128/80   Pulse 68   Ht 5' 10 (1.778 m)   Wt 236 lb (107 kg)   BMI 33.86 kg/m   Past Medical History:  Diagnosis Date   Hypertension    Sleep apnea    Type 2 diabetes mellitus without complication (HCC)     Social History   Socioeconomic History   Marital status: Married    Spouse name: Not on file   Number of children: Not on file   Years of education: Not on file   Highest education level: Not on file  Occupational History   Not on file  Tobacco Use   Smoking status: Never    Passive exposure: Never   Smokeless tobacco: Never  Vaping Use   Vaping status: Never Used  Substance and Sexual Activity   Alcohol use: Never   Drug use: Never   Sexual activity: Not Currently  Other Topics Concern   Not on file  Social History Narrative   Not on file   Social Drivers of Health   Financial Resource Strain: Low Risk  (01/22/2024)   Received  from [facility]   Overall Financial Resource Strain (CARDIA)    Difficulty of Paying Living Expenses: Not hard at all  Recent Concern: Financial Resource Strain - Medium Risk (11/05/2023)   Received from [facility]   Overall Financial Resource Strain (CARDIA)    Difficulty of Paying Living Expenses: Somewhat hard  Food Insecurity: No Food Insecurity (01/22/2024)   Received from [facility]   Hunger Vital Sign    Within the past 12 months, you worried that your food would run out before you got the money to buy more.: Never true    Within the past 12 months, the food you bought just didn't last and you didn't have money to get more.: Never true  Transportation Needs: No Transportation Needs (01/22/2024)   Received from [facility] -  Transportation    In the past 12 months, has lack of transportation kept you from medical appointments or from getting medications?: No    Lack of Transportation (Non-Medical): No  Physical Activity: Not on file  Stress: Not on file  Social Connections: Not on file  Intimate Partner Violence: Not on file    Past Surgical History:  Procedure Laterality Date   COLONOSCOPY WITH PROPOFOL  N/A 01/20/2024   Procedure: COLONOSCOPY WITH PROPOFOL ;  Surgeon: Maryruth Ole DASEN, MD;  Location: [facility] ENDOSCOPY;  Service: Endoscopy;  Laterality: N/A;  GUJARATI  INTERPRETER - DM   None      Family History  Problem Relation Age of Onset   Heart attack Father    Hypertension Father     No Known Allergies     Latest Ref Rng & Units 12/31/2022   11:21 AM  CBC  WBC 4.0 - 10.5 K/uL 7.5   Hemoglobin 13.0 - 17.0 g/dL 86.7   Hematocrit 60.9 - 52.0 % 41.2   Platelets 150 - 400 K/uL 287       CMP     Component Value Date/Time   CREATININE 0.70 08/18/2018 1207     No results found.     Assessment & Plan:   1. Varicose veins with inflammation (Primary) Recommend:  The patient has had successful ablation of the previously incompetent saphenous venous system but still has persistent symptoms of pain and swelling that are having a negative impact on daily life and daily activities.  Patient should undergo injection sclerotherapy to treat the residual varicosities.  The risks, benefits and alternative therapies were reviewed in detail with the patient.  All questions were answered.  The patient agrees to proceed with sclerotherapy at their convenience.  The patient will continue wearing the graduated compression stockings and using the over-the-counter pain medications to treat her symptoms.      2. Benign essential hypertension Continue antihypertensive medications as already ordered, these medications have been reviewed and there are no changes at this time.   Current Outpatient  Medications on File Prior to Visit  Medication Sig Dispense Refill   aspirin EC 81 MG tablet Take 81 mg by mouth once.     atorvastatin (LIPITOR) 10 MG tablet Take 10 mg by mouth daily.     clotrimazole-betamethasone (LOTRISONE) cream Apply 1 Application topically 2 (two) times daily.     Continuous Glucose Sensor (GUARDIAN SENSOR 3) MISC Use 1 each every 14 (fourteen) days     glipiZIDE (GLUCOTROL XL) 10 MG 24 hr tablet Take 10 mg by mouth daily with breakfast.     lisinopril-hydrochlorothiazide (ZESTORETIC)  20-12.5 MG tablet Take 1 tablet by mouth daily.     TRULICITY 3 MG/0.5ML SOAJ SMARTSIG:0.5 Milliliter(s) SUB-Q Once a Week     Vitamin D, Ergocalciferol, (DRISDOL) 1.25 MG (50000 UNIT) CAPS capsule Take 50,000 Units by mouth once a week.     ALPRAZolam  (XANAX ) 0.5 MG tablet Take 1 tab one hour before procedure and 1 tab when you arrive in office (Patient not taking: Reported on 03/04/2024) 2 tablet 0   furosemide (LASIX) 20 MG tablet Take 20 mg by mouth daily. (Patient not taking: Reported on 03/04/2024)     metFORMIN (GLUCOPHAGE) 500 MG tablet Take 500 mg by mouth 2 (two) times daily with a meal.     No current facility-administered medications on file prior to visit.    There are no Patient Instructions on file for this visit. No follow-ups on file.   Jaide Hillenburg E Lynette Topete, NP

## 2024-03-11 DIAGNOSIS — E539 Vitamin B deficiency, unspecified: Secondary | ICD-10-CM | POA: Diagnosis not present

## 2024-03-11 DIAGNOSIS — I110 Hypertensive heart disease with heart failure: Secondary | ICD-10-CM | POA: Diagnosis not present

## 2024-03-11 DIAGNOSIS — Z6833 Body mass index (BMI) 33.0-33.9, adult: Secondary | ICD-10-CM | POA: Diagnosis not present

## 2024-03-11 DIAGNOSIS — E1165 Type 2 diabetes mellitus with hyperglycemia: Secondary | ICD-10-CM | POA: Diagnosis not present

## 2024-03-11 DIAGNOSIS — Z7985 Long-term (current) use of injectable non-insulin antidiabetic drugs: Secondary | ICD-10-CM | POA: Diagnosis not present

## 2024-03-11 DIAGNOSIS — Z7984 Long term (current) use of oral hypoglycemic drugs: Secondary | ICD-10-CM | POA: Diagnosis not present

## 2024-03-11 DIAGNOSIS — Z008 Encounter for other general examination: Secondary | ICD-10-CM | POA: Diagnosis not present

## 2024-03-11 DIAGNOSIS — G4730 Sleep apnea, unspecified: Secondary | ICD-10-CM | POA: Diagnosis not present

## 2024-03-11 DIAGNOSIS — E669 Obesity, unspecified: Secondary | ICD-10-CM | POA: Diagnosis not present

## 2024-03-11 DIAGNOSIS — E785 Hyperlipidemia, unspecified: Secondary | ICD-10-CM | POA: Diagnosis not present

## 2024-03-11 DIAGNOSIS — Z8249 Family history of ischemic heart disease and other diseases of the circulatory system: Secondary | ICD-10-CM | POA: Diagnosis not present

## 2024-03-11 DIAGNOSIS — Z833 Family history of diabetes mellitus: Secondary | ICD-10-CM | POA: Diagnosis not present

## 2024-03-11 DIAGNOSIS — I509 Heart failure, unspecified: Secondary | ICD-10-CM | POA: Diagnosis not present

## 2024-03-20 ENCOUNTER — Telehealth (INDEPENDENT_AMBULATORY_CARE_PROVIDER_SITE_OTHER): Payer: Self-pay | Admitting: Nurse Practitioner

## 2024-03-20 NOTE — Telephone Encounter (Signed)
 LVM for pt TCB and schedule appt  bilateral SALINE sclero. see fb. no prior auth req. X 3

## 2024-04-16 DIAGNOSIS — E1165 Type 2 diabetes mellitus with hyperglycemia: Secondary | ICD-10-CM | POA: Diagnosis not present

## 2024-04-23 DIAGNOSIS — I10 Essential (primary) hypertension: Secondary | ICD-10-CM | POA: Diagnosis not present

## 2024-04-23 DIAGNOSIS — E782 Mixed hyperlipidemia: Secondary | ICD-10-CM | POA: Diagnosis not present

## 2024-04-23 DIAGNOSIS — I83893 Varicose veins of bilateral lower extremities with other complications: Secondary | ICD-10-CM | POA: Diagnosis not present

## 2024-04-23 DIAGNOSIS — E1165 Type 2 diabetes mellitus with hyperglycemia: Secondary | ICD-10-CM | POA: Diagnosis not present

## 2024-04-23 DIAGNOSIS — G4733 Obstructive sleep apnea (adult) (pediatric): Secondary | ICD-10-CM | POA: Diagnosis not present

## 2024-04-24 ENCOUNTER — Ambulatory Visit (INDEPENDENT_AMBULATORY_CARE_PROVIDER_SITE_OTHER): Admitting: Nurse Practitioner

## 2024-04-24 VITALS — BP 118/76 | HR 93 | Ht 70.0 in | Wt 219.0 lb

## 2024-04-24 DIAGNOSIS — I8310 Varicose veins of unspecified lower extremity with inflammation: Secondary | ICD-10-CM

## 2024-04-24 DIAGNOSIS — I8312 Varicose veins of left lower extremity with inflammation: Secondary | ICD-10-CM

## 2024-04-26 ENCOUNTER — Encounter (INDEPENDENT_AMBULATORY_CARE_PROVIDER_SITE_OTHER): Payer: Self-pay | Admitting: Nurse Practitioner

## 2024-04-26 NOTE — Progress Notes (Signed)
Varicose veins of left lower extremity with inflammation (454.1  I83.10) Current Plans   Indication: Patient presents with symptomatic varicose veins of the left lower extremity.   Procedure: Sclerotherapy using hypertonic saline mixed with 1% Lidocaine was performed on the left lower extremity. Compression wraps were placed. The patient tolerated the procedure well. 

## 2024-05-26 ENCOUNTER — Encounter (INDEPENDENT_AMBULATORY_CARE_PROVIDER_SITE_OTHER): Payer: Self-pay | Admitting: Nurse Practitioner

## 2024-05-26 ENCOUNTER — Ambulatory Visit (INDEPENDENT_AMBULATORY_CARE_PROVIDER_SITE_OTHER): Admitting: Nurse Practitioner

## 2024-05-26 VITALS — BP 121/75 | HR 68 | Ht 70.0 in | Wt 234.4 lb

## 2024-05-26 DIAGNOSIS — I8310 Varicose veins of unspecified lower extremity with inflammation: Secondary | ICD-10-CM

## 2024-05-26 DIAGNOSIS — I8312 Varicose veins of left lower extremity with inflammation: Secondary | ICD-10-CM | POA: Diagnosis not present

## 2024-05-31 ENCOUNTER — Encounter (INDEPENDENT_AMBULATORY_CARE_PROVIDER_SITE_OTHER): Payer: Self-pay | Admitting: Nurse Practitioner

## 2024-05-31 NOTE — Progress Notes (Signed)
Varicose veins of left lower extremity with inflammation (454.1  I83.10) Current Plans   Indication: Patient presents with symptomatic varicose veins of the left lower extremity.   Procedure: Sclerotherapy using hypertonic saline mixed with 1% Lidocaine was performed on the left lower extremity. Compression wraps were placed. The patient tolerated the procedure well. 

## 2024-06-22 ENCOUNTER — Ambulatory Visit (INDEPENDENT_AMBULATORY_CARE_PROVIDER_SITE_OTHER): Admitting: Nurse Practitioner

## 2024-07-17 ENCOUNTER — Encounter (INDEPENDENT_AMBULATORY_CARE_PROVIDER_SITE_OTHER): Payer: Self-pay | Admitting: Nurse Practitioner

## 2024-07-17 ENCOUNTER — Ambulatory Visit (INDEPENDENT_AMBULATORY_CARE_PROVIDER_SITE_OTHER): Admitting: Nurse Practitioner

## 2024-07-17 VITALS — BP 138/84 | HR 82 | Resp 18 | Wt 235.0 lb

## 2024-07-17 DIAGNOSIS — I8312 Varicose veins of left lower extremity with inflammation: Secondary | ICD-10-CM

## 2024-07-17 DIAGNOSIS — I8310 Varicose veins of unspecified lower extremity with inflammation: Secondary | ICD-10-CM

## 2024-07-18 ENCOUNTER — Encounter (INDEPENDENT_AMBULATORY_CARE_PROVIDER_SITE_OTHER): Payer: Self-pay | Admitting: Nurse Practitioner

## 2024-07-18 NOTE — Progress Notes (Signed)
Varicose veins of left lower extremity with inflammation (454.1  I83.10) Current Plans   Indication: Patient presents with symptomatic varicose veins of the left lower extremity.   Procedure: Sclerotherapy using hypertonic saline mixed with 1% Lidocaine was performed on the left lower extremity. Compression wraps were placed. The patient tolerated the procedure well. 

## 2024-08-17 DIAGNOSIS — E1165 Type 2 diabetes mellitus with hyperglycemia: Secondary | ICD-10-CM | POA: Diagnosis not present

## 2024-08-24 DIAGNOSIS — G4733 Obstructive sleep apnea (adult) (pediatric): Secondary | ICD-10-CM | POA: Diagnosis not present

## 2024-08-24 DIAGNOSIS — I83893 Varicose veins of bilateral lower extremities with other complications: Secondary | ICD-10-CM | POA: Diagnosis not present

## 2024-08-24 DIAGNOSIS — E1165 Type 2 diabetes mellitus with hyperglycemia: Secondary | ICD-10-CM | POA: Diagnosis not present

## 2024-08-24 DIAGNOSIS — E782 Mixed hyperlipidemia: Secondary | ICD-10-CM | POA: Diagnosis not present

## 2024-08-24 DIAGNOSIS — I10 Essential (primary) hypertension: Secondary | ICD-10-CM | POA: Diagnosis not present
